# Patient Record
Sex: Female | Born: 1960 | Race: Black or African American | Hispanic: No | Marital: Married | Smoking: Current every day smoker
Health system: Southern US, Community
[De-identification: ages and names within clinical notes are randomized; demographics above are authoritative.]

## PROBLEM LIST (undated history)

## (undated) DIAGNOSIS — I10 Essential (primary) hypertension: Secondary | ICD-10-CM

## (undated) DIAGNOSIS — K5780 Diverticulitis of intestine, part unspecified, with perforation and abscess without bleeding: Secondary | ICD-10-CM

## (undated) HISTORY — PX: HERNIA REPAIR: SHX51

---

## 2016-03-28 DIAGNOSIS — F17200 Nicotine dependence, unspecified, uncomplicated: Secondary | ICD-10-CM | POA: Insufficient documentation

## 2016-03-28 DIAGNOSIS — L309 Dermatitis, unspecified: Secondary | ICD-10-CM | POA: Insufficient documentation

## 2016-04-04 DIAGNOSIS — R079 Chest pain, unspecified: Secondary | ICD-10-CM | POA: Insufficient documentation

## 2016-04-04 DIAGNOSIS — I10 Essential (primary) hypertension: Secondary | ICD-10-CM | POA: Insufficient documentation

## 2016-12-06 DIAGNOSIS — K5792 Diverticulitis of intestine, part unspecified, without perforation or abscess without bleeding: Secondary | ICD-10-CM | POA: Insufficient documentation

## 2016-12-06 DIAGNOSIS — Z720 Tobacco use: Secondary | ICD-10-CM | POA: Insufficient documentation

## 2019-01-07 ENCOUNTER — Emergency Department (HOSPITAL_COMMUNITY)
Admission: EM | Admit: 2019-01-07 | Discharge: 2019-01-07 | Disposition: A | Discharge status: Home / Self Care | Payer: BLUE CROSS/BLUE SHIELD | Attending: Emergency Medicine | Admitting: Emergency Medicine

## 2019-01-07 ENCOUNTER — Encounter (HOSPITAL_COMMUNITY): Payer: Self-pay | Admitting: *Deleted

## 2019-01-07 DIAGNOSIS — M545 Low back pain: Secondary | ICD-10-CM | POA: Diagnosis present

## 2019-01-07 DIAGNOSIS — M5432 Sciatica, left side: Secondary | ICD-10-CM | POA: Diagnosis not present

## 2019-01-07 MED ORDER — PREDNISONE 20 MG PO TABS: ORAL_TABLET | ORAL | 0 refills | Status: DC

## 2019-01-07 MED ORDER — IBUPROFEN 600 MG PO TABS: 600.0000 mg | ORAL_TABLET | Freq: Four times a day (QID) | ORAL | 0 refills | Status: DC | PRN

## 2019-01-07 NOTE — ED Notes (Signed)
Pt discharged from ED; instructions provided and scripts given; Pt encouraged to return to ED if symptoms worsen and to f/u with PCP; Pt verbalized understanding of all instructions 

## 2019-01-07 NOTE — ED Provider Notes (Signed)
[facility] Provider Note   CSN: [id number] Arrival date & time: 01/07/19  1808     History   Chief Complaint Chief Complaint  Patient presents with  . Back Pain    HPI Elaine Martinez is a 58 y.o. female.  The history is provided by the patient. No language interpreter was used.  Back Pain  Associated symptoms: no fever and no numbness      58 year old female presenting for evaluation of back pain.  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  She does a lot of heavy lifting and 3 months ago she injured her lower back.  Since then she has had recurrent pain to her lower back but for the past 5 days pain now radiates down to her left leg extending towards her knee.  She described pain as a sharp shooting pain, moderate in severity worsening with ambulation.  She tries taking Flexeril that was previously prescribed with some improvement.  She does not complain of any fever or chills no lightheadedness or dizziness no abdominal pain dysuria hematuria, bowel bladder incontinence or saddle anesthesia.  No prior history of PE or DVT, no recent surgery, prolonged bedrest, active cancer, hemoptysis, leg swelling or pain in her calf.  History reviewed. No pertinent past medical history.  There are no active problems to display for this patient.    The histories are not reviewed yet. Please review them in the "History" navigator section and refresh this SmartLink.   OB History   No obstetric history on file.      Home Medications    Prior to Admission medications   Not on File    Family History History reviewed. No pertinent family history.  Social History Social History   Tobacco Use  . Smoking status: Not on file  Substance Use Topics  . Alcohol use: Not on file  . Drug use: Not on file     Allergies   Patient has no known allergies.   Review of Systems Review of Systems    Constitutional: Negative for fever.  Musculoskeletal: Positive for back pain.  Neurological: Negative for numbness.  All other systems reviewed and are negative.    Physical Exam Updated Vital Signs BP (!) 135/115 (BP Location: Right Arm)   Pulse (!) 105   Temp 98.3 F (36.8 C) (Oral)   Resp 18   SpO2 98%   Physical Exam Vitals signs and nursing note reviewed.  Constitutional:      General: She is not in acute distress.    Appearance: She is well-developed.  HENT:     Head: Atraumatic.  Eyes:     Conjunctiva/sclera: Conjunctivae normal.  Neck:     Musculoskeletal: Neck supple.  Musculoskeletal:        General: Tenderness (Mild tenderness to lumbar and paralumbar spinal muscle.  Positive straight leg raise left leg.  Patellar deep tendon reflex intact bilaterally with intact distal pedal pulse.  Able to ambulate.) present.  Skin:    Findings: No rash.  Neurological:     Mental Status: She is alert.      ED Treatments / Results  Labs (all labs ordered are listed, but only abnormal results are displayed) Labs Reviewed - No data to display  EKG None  Radiology No results found.  Procedures Procedures (including critical care time)  Medications Ordered in ED Medications - No data to display   Initial Impression /  Assessment and Plan / ED Course  I have reviewed the triage vital signs and the nursing notes.  Pertinent labs & imaging results that were available during my care of the patient were reviewed by me and considered in my medical decision making (see chart for details).     BP (!) 135/115 (BP Location: Right Arm)   Pulse (!) 105   Temp 98.3 F (36.8 C) (Oral)   Resp 18   SpO2 98%    Final Clinical Impressions(s) / ED Diagnoses   Final diagnoses:  Sciatica, left side    ED Discharge Orders    None     7:18 PM Patient here with radicular left leg pain suggestive of sciatica.  Low suspicion for DVT.  No red flags.  She is able to  ambulate.  She is neurovascular intact.  Patient discharged home with steroids, and anti-inflammatory medication.  Return precaution discussed.   Fayrene Helper, PA-C 01/07/19 2028    Eber Hong, MD 01/09/19 [phone]

## 2019-01-07 NOTE — ED Triage Notes (Signed)
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

## 2020-02-27 ENCOUNTER — Ambulatory Visit: Payer: BLUE CROSS/BLUE SHIELD | Attending: Internal Medicine

## 2020-02-27 DIAGNOSIS — Z23 Encounter for immunization: Secondary | ICD-10-CM

## 2020-02-27 NOTE — Progress Notes (Signed)
   Covid-19 Vaccination Clinic  Name:  Elaine Martinez    MRN: [id number] DOB: September 22, 1961  02/27/2020  Elaine Martinez was observed post Covid-19 immunization for 15 minutes without incident. She was provided with Vaccine Information Sheet and instruction to access the V-Safe system.   Elaine Martinez was instructed to call 911 with any severe reactions post vaccine: Marland Kitchen Difficulty breathing  . Swelling of face and throat  . A fast heartbeat  . A bad rash all over body  . Dizziness and weakness   Immunizations Administered    Name Date Dose VIS Date Route   Pfizer COVID-19 Vaccine 02/27/2020  4:15 PM 0.3 mL 11/29/2019 Intramuscular   Manufacturer: [employer], [employer]   Lot: [id number]   NDC: [address]-5

## 2020-03-23 ENCOUNTER — Ambulatory Visit: Payer: BLUE CROSS/BLUE SHIELD | Attending: Internal Medicine

## 2020-03-23 ENCOUNTER — Ambulatory Visit: Payer: BLUE CROSS/BLUE SHIELD

## 2020-03-23 DIAGNOSIS — Z23 Encounter for immunization: Secondary | ICD-10-CM

## 2020-03-23 NOTE — Progress Notes (Signed)
   Covid-19 Vaccination Clinic  Name:  Elaine Martinez    MRN: [id number] DOB: Sep 29, 1961  03/23/2020  [name] was observed post Covid-19 immunization for 15 minutes without incident. She was provided with Vaccine Information Sheet and instruction to access the V-Safe system.   [name] was instructed to call 911 with any severe reactions post vaccine: Marland Kitchen Difficulty breathing  . Swelling of face and throat  . A fast heartbeat  . A bad rash all over body  . Dizziness and weakness   Immunizations Administered    Name Date Dose VIS Date Route   Pfizer COVID-19 Vaccine 03/23/2020  2:27 PM 0.3 mL 11/29/2019 Intramuscular   Manufacturer: [employer], [employer]   Lot: [id number]   NDC: [address]-8

## 2020-10-20 ENCOUNTER — Other Ambulatory Visit: Payer: Self-pay

## 2020-10-20 ENCOUNTER — Inpatient Hospital Stay (HOSPITAL_COMMUNITY)
Admission: EM | Admit: 2020-10-20 | Discharge: 2020-10-22 | DRG: 392 | Disposition: A | Discharge status: Home / Self Care | Payer: BLUE CROSS/BLUE SHIELD | Attending: Internal Medicine | Admitting: Internal Medicine

## 2020-10-20 DIAGNOSIS — K529 Noninfective gastroenteritis and colitis, unspecified: Principal | ICD-10-CM | POA: Diagnosis present

## 2020-10-20 DIAGNOSIS — E860 Dehydration: Secondary | ICD-10-CM | POA: Diagnosis present

## 2020-10-20 DIAGNOSIS — I10 Essential (primary) hypertension: Secondary | ICD-10-CM | POA: Diagnosis present

## 2020-10-20 DIAGNOSIS — R188 Other ascites: Secondary | ICD-10-CM

## 2020-10-20 DIAGNOSIS — E872 Acidosis, unspecified: Secondary | ICD-10-CM

## 2020-10-20 DIAGNOSIS — F17200 Nicotine dependence, unspecified, uncomplicated: Secondary | ICD-10-CM | POA: Diagnosis present

## 2020-10-20 DIAGNOSIS — R000 Tachycardia, unspecified: Secondary | ICD-10-CM | POA: Diagnosis present

## 2020-10-20 DIAGNOSIS — E669 Obesity, unspecified: Secondary | ICD-10-CM | POA: Diagnosis present

## 2020-10-20 DIAGNOSIS — Z20822 Contact with and (suspected) exposure to covid-19: Secondary | ICD-10-CM | POA: Diagnosis present

## 2020-10-20 DIAGNOSIS — E876 Hypokalemia: Secondary | ICD-10-CM | POA: Diagnosis present

## 2020-10-20 DIAGNOSIS — K5730 Diverticulosis of large intestine without perforation or abscess without bleeding: Secondary | ICD-10-CM | POA: Diagnosis present

## 2020-10-20 DIAGNOSIS — N179 Acute kidney failure, unspecified: Secondary | ICD-10-CM | POA: Diagnosis present

## 2020-10-20 DIAGNOSIS — D649 Anemia, unspecified: Secondary | ICD-10-CM | POA: Diagnosis present

## 2020-10-20 DIAGNOSIS — R197 Diarrhea, unspecified: Secondary | ICD-10-CM

## 2020-10-20 DIAGNOSIS — R1030 Lower abdominal pain, unspecified: Secondary | ICD-10-CM | POA: Diagnosis not present

## 2020-10-20 DIAGNOSIS — R109 Unspecified abdominal pain: Secondary | ICD-10-CM

## 2020-10-20 DIAGNOSIS — R6510 Systemic inflammatory response syndrome (SIRS) of non-infectious origin without acute organ dysfunction: Secondary | ICD-10-CM | POA: Diagnosis present

## 2020-10-20 DIAGNOSIS — Z9851 Tubal ligation status: Secondary | ICD-10-CM

## 2020-10-20 DIAGNOSIS — Z6837 Body mass index (BMI) 37.0-37.9, adult: Secondary | ICD-10-CM

## 2020-10-20 HISTORY — DX: Diverticulitis of intestine, part unspecified, with perforation and abscess without bleeding: K57.80

## 2020-10-20 LAB — URINALYSIS, ROUTINE W REFLEX MICROSCOPIC
Glucose, UA: NEGATIVE mg/dL
Ketones, ur: 5 mg/dL — AB
Leukocytes,Ua: NEGATIVE
Nitrite: NEGATIVE
Protein, ur: 30 mg/dL — AB
Specific Gravity, Urine: 1.021 (ref 1.005–1.030)
pH: 5 (ref 5.0–8.0)

## 2020-10-20 LAB — COMPREHENSIVE METABOLIC PANEL
ALT: 24 U/L (ref 0–44)
AST: 32 U/L (ref 15–41)
Albumin: 4 g/dL (ref 3.5–5.0)
Alkaline Phosphatase: 77 U/L (ref 38–126)
Anion gap: 13 (ref 5–15)
BUN: 23 mg/dL — ABNORMAL HIGH (ref 6–20)
CO2: 20 mmol/L — ABNORMAL LOW (ref 22–32)
Calcium: 9.4 mg/dL (ref 8.9–10.3)
Chloride: 104 mmol/L (ref 98–111)
Creatinine, Ser: 2.4 mg/dL — ABNORMAL HIGH (ref 0.44–1.00)
GFR, Estimated: 23 mL/min — ABNORMAL LOW (ref >60)
Glucose, Bld: 123 mg/dL — ABNORMAL HIGH (ref 70–99)
Potassium: 3.5 mmol/L (ref 3.5–5.1)
Sodium: 137 mmol/L (ref 135–145)
Total Bilirubin: 0.8 mg/dL (ref 0.3–1.2)
Total Protein: 7.6 g/dL (ref 6.5–8.1)

## 2020-10-20 LAB — CBC
HCT: 48.5 % — ABNORMAL HIGH (ref 36.0–46.0)
Hemoglobin: 16.5 g/dL — ABNORMAL HIGH (ref 12.0–15.0)
MCH: 33.7 pg (ref 26.0–34.0)
MCHC: 34 g/dL (ref 30.0–36.0)
MCV: 99.2 fL (ref 80.0–100.0)
Platelets: 253 10*3/uL (ref 150–400)
RBC: 4.89 MIL/uL (ref 3.87–5.11)
RDW: 12.4 % (ref 11.5–15.5)
WBC: 17.5 10*3/uL — ABNORMAL HIGH (ref 4.0–10.5)
nRBC: 0 % (ref 0.0–0.2)

## 2020-10-20 LAB — LIPASE, BLOOD: Lipase: 31 U/L (ref 11–51)

## 2020-10-20 NOTE — ED Notes (Signed)
Pt stated she was stepping outside

## 2020-10-20 NOTE — ED Triage Notes (Signed)
C/o upper abdominal pain started yesterday along w/ vomiting.

## 2020-10-21 ENCOUNTER — Observation Stay (HOSPITAL_COMMUNITY): Payer: BLUE CROSS/BLUE SHIELD

## 2020-10-21 ENCOUNTER — Emergency Department (HOSPITAL_COMMUNITY): Payer: BLUE CROSS/BLUE SHIELD

## 2020-10-21 ENCOUNTER — Encounter (HOSPITAL_COMMUNITY): Payer: Self-pay | Admitting: Internal Medicine

## 2020-10-21 DIAGNOSIS — Z20822 Contact with and (suspected) exposure to covid-19: Secondary | ICD-10-CM | POA: Diagnosis present

## 2020-10-21 DIAGNOSIS — E876 Hypokalemia: Secondary | ICD-10-CM | POA: Diagnosis present

## 2020-10-21 DIAGNOSIS — E872 Acidosis, unspecified: Secondary | ICD-10-CM

## 2020-10-21 DIAGNOSIS — R197 Diarrhea, unspecified: Secondary | ICD-10-CM

## 2020-10-21 DIAGNOSIS — E669 Obesity, unspecified: Secondary | ICD-10-CM | POA: Diagnosis present

## 2020-10-21 DIAGNOSIS — N179 Acute kidney failure, unspecified: Secondary | ICD-10-CM | POA: Diagnosis present

## 2020-10-21 DIAGNOSIS — R109 Unspecified abdominal pain: Secondary | ICD-10-CM | POA: Diagnosis present

## 2020-10-21 DIAGNOSIS — I10 Essential (primary) hypertension: Secondary | ICD-10-CM | POA: Diagnosis present

## 2020-10-21 DIAGNOSIS — E860 Dehydration: Secondary | ICD-10-CM | POA: Diagnosis present

## 2020-10-21 DIAGNOSIS — D649 Anemia, unspecified: Secondary | ICD-10-CM | POA: Diagnosis present

## 2020-10-21 DIAGNOSIS — R6510 Systemic inflammatory response syndrome (SIRS) of non-infectious origin without acute organ dysfunction: Secondary | ICD-10-CM

## 2020-10-21 DIAGNOSIS — R1030 Lower abdominal pain, unspecified: Secondary | ICD-10-CM | POA: Diagnosis present

## 2020-10-21 DIAGNOSIS — K5730 Diverticulosis of large intestine without perforation or abscess without bleeding: Secondary | ICD-10-CM | POA: Diagnosis present

## 2020-10-21 DIAGNOSIS — Z9851 Tubal ligation status: Secondary | ICD-10-CM | POA: Diagnosis not present

## 2020-10-21 DIAGNOSIS — R000 Tachycardia, unspecified: Secondary | ICD-10-CM | POA: Diagnosis present

## 2020-10-21 DIAGNOSIS — Z6837 Body mass index (BMI) 37.0-37.9, adult: Secondary | ICD-10-CM | POA: Diagnosis not present

## 2020-10-21 DIAGNOSIS — K529 Noninfective gastroenteritis and colitis, unspecified: Secondary | ICD-10-CM | POA: Diagnosis present

## 2020-10-21 DIAGNOSIS — F17200 Nicotine dependence, unspecified, uncomplicated: Secondary | ICD-10-CM | POA: Diagnosis present

## 2020-10-21 LAB — CBC
HCT: 43 % (ref 36.0–46.0)
Hemoglobin: 14.4 g/dL (ref 12.0–15.0)
MCH: 34 pg (ref 26.0–34.0)
MCHC: 33.5 g/dL (ref 30.0–36.0)
MCV: 101.4 fL — ABNORMAL HIGH (ref 80.0–100.0)
Platelets: 203 10*3/uL (ref 150–400)
RBC: 4.24 MIL/uL (ref 3.87–5.11)
RDW: 12.5 % (ref 11.5–15.5)
WBC: 14 10*3/uL — ABNORMAL HIGH (ref 4.0–10.5)
nRBC: 0 % (ref 0.0–0.2)

## 2020-10-21 LAB — SODIUM, URINE, RANDOM: Sodium, Ur: 62 mmol/L

## 2020-10-21 LAB — RESPIRATORY PANEL BY RT PCR (FLU A&B, COVID)
Influenza A by PCR: NEGATIVE
Influenza B by PCR: NEGATIVE
SARS Coronavirus 2 by RT PCR: NEGATIVE

## 2020-10-21 LAB — BASIC METABOLIC PANEL
Anion gap: 13 (ref 5–15)
BUN: 26 mg/dL — ABNORMAL HIGH (ref 6–20)
CO2: 20 mmol/L — ABNORMAL LOW (ref 22–32)
Calcium: 8.5 mg/dL — ABNORMAL LOW (ref 8.9–10.3)
Chloride: 105 mmol/L (ref 98–111)
Creatinine, Ser: 2.76 mg/dL — ABNORMAL HIGH (ref 0.44–1.00)
GFR, Estimated: 19 mL/min — ABNORMAL LOW (ref >60)
Glucose, Bld: 100 mg/dL — ABNORMAL HIGH (ref 70–99)
Potassium: 3.3 mmol/L — ABNORMAL LOW (ref 3.5–5.1)
Sodium: 138 mmol/L (ref 135–145)

## 2020-10-21 LAB — HIV ANTIBODY (ROUTINE TESTING W REFLEX): HIV Screen 4th Generation wRfx: NONREACTIVE

## 2020-10-21 LAB — CREATININE, URINE, RANDOM: Creatinine, Urine: 122.01 mg/dL

## 2020-10-21 LAB — LACTIC ACID, PLASMA
Lactic Acid, Venous: 1 mmol/L (ref 0.5–1.9)
Lactic Acid, Venous: 0.9 mmol/L (ref 0.5–1.9)

## 2020-10-21 MED ORDER — PIPERACILLIN-TAZOBACTAM 3.375 G IVPB 30 MIN
3.3750 g | Freq: Once | INTRAVENOUS | Status: AC
  Administered 2020-10-21: 3.375 g via INTRAVENOUS
  Filled 2020-10-21: qty 50

## 2020-10-21 MED ORDER — SODIUM CHLORIDE 0.9 % IV SOLN
2.0000 g | INTRAVENOUS | Status: DC
  Administered 2020-10-21 – 2020-10-22 (×2): 2 g via INTRAVENOUS
  Filled 2020-10-21: qty 20
  Filled 2020-10-21: qty 2
  Filled 2020-10-21: qty 20

## 2020-10-21 MED ORDER — ACETAMINOPHEN 650 MG RE SUPP: 650.0000 mg | Freq: Four times a day (QID) | RECTAL | Status: DC | PRN

## 2020-10-21 MED ORDER — SODIUM CHLORIDE 0.9 % IV BOLUS
1000.0000 mL | Freq: Once | INTRAVENOUS | Status: AC
  Administered 2020-10-21: 1000 mL via INTRAVENOUS

## 2020-10-21 MED ORDER — HEPARIN SODIUM (PORCINE) 5000 UNIT/ML IJ SOLN
5000.0000 [IU] | Freq: Three times a day (TID) | INTRAMUSCULAR | Status: DC
  Administered 2020-10-21 – 2020-10-22 (×5): 5000 [IU] via SUBCUTANEOUS
  Filled 2020-10-21 (×5): qty 1

## 2020-10-21 MED ORDER — ONDANSETRON HCL 4 MG/2ML IJ SOLN: 4.0000 mg | Freq: Four times a day (QID) | INTRAMUSCULAR | Status: DC | PRN

## 2020-10-21 MED ORDER — SODIUM CHLORIDE 0.9 % IV SOLN: INTRAVENOUS | Status: DC

## 2020-10-21 MED ORDER — ACETAMINOPHEN 325 MG PO TABS
650.0000 mg | ORAL_TABLET | Freq: Four times a day (QID) | ORAL | Status: DC | PRN
  Administered 2020-10-21 – 2020-10-22 (×3): 650 mg via ORAL
  Filled 2020-10-21 (×3): qty 2

## 2020-10-21 MED ORDER — SIMETHICONE 80 MG PO CHEW: 80.0000 mg | CHEWABLE_TABLET | Freq: Four times a day (QID) | ORAL | Status: DC | PRN

## 2020-10-21 MED ORDER — MORPHINE SULFATE (PF) 4 MG/ML IV SOLN
4.0000 mg | Freq: Once | INTRAVENOUS | Status: AC
  Administered 2020-10-21: 4 mg via INTRAVENOUS
  Filled 2020-10-21: qty 1

## 2020-10-21 MED ORDER — LACTATED RINGERS IV BOLUS
1000.0000 mL | Freq: Once | INTRAVENOUS | Status: AC
  Administered 2020-10-21: 1000 mL via INTRAVENOUS

## 2020-10-21 MED ORDER — LACTATED RINGERS IV SOLN: INTRAVENOUS | Status: DC

## 2020-10-21 MED ORDER — POTASSIUM CHLORIDE CRYS ER 20 MEQ PO TBCR
40.0000 meq | EXTENDED_RELEASE_TABLET | Freq: Once | ORAL | Status: AC
  Administered 2020-10-21: 40 meq via ORAL
  Filled 2020-10-21: qty 2

## 2020-10-21 MED ORDER — PIPERACILLIN-TAZOBACTAM 3.375 G IVPB: 3.3750 g | Freq: Three times a day (TID) | INTRAVENOUS | Status: DC

## 2020-10-21 MED ORDER — SODIUM CHLORIDE 0.9 % IV BOLUS
500.0000 mL | Freq: Once | INTRAVENOUS | Status: AC
  Administered 2020-10-21: 500 mL via INTRAVENOUS

## 2020-10-21 MED ORDER — METRONIDAZOLE IN NACL 5-0.79 MG/ML-% IV SOLN
500.0000 mg | Freq: Three times a day (TID) | INTRAVENOUS | Status: DC
  Administered 2020-10-21 – 2020-10-22 (×4): 500 mg via INTRAVENOUS
  Filled 2020-10-21 (×4): qty 100

## 2020-10-21 MED ORDER — ONDANSETRON HCL 4 MG/2ML IJ SOLN
4.0000 mg | Freq: Once | INTRAMUSCULAR | Status: AC
  Administered 2020-10-21: 4 mg via INTRAVENOUS
  Filled 2020-10-21: qty 2

## 2020-10-21 NOTE — ED Provider Notes (Signed)
[facility] EMERGENCY DEPARTMENT Provider Note   CSN: [id number] Arrival date & time: 10/20/20  1730     History Chief Complaint  Patient presents with  . Abdominal Pain    Elaine Martinez is a 59 y.o. female.  59 yo F with a chief complaints of lower abdominal pain.  This been going on for the past few days.  She has been having diarrhea.  She also has been coughing quite a bit.  No fevers a couple episodes of emesis.  She has a history of diverticulitis that she thinks feels somewhat similar.  Has a history of hernia repair with mesh placement that had subsequent mesh removal.  Describes the pain is sharp and shooting.  Worse with coughing.  Has not felt like eating or drinking.  Has forced her self to drink a little bit.  The history is provided by the patient.  Abdominal Pain Pain location:  LLQ and RLQ Pain quality: aching and sharp   Pain radiates to:  Does not radiate Pain severity:  Moderate Onset quality:  Gradual Duration:  2 days Timing:  Constant Progression:  Worsening Chronicity:  New Relieved by:  Nothing Worsened by:  Nothing Ineffective treatments:  None tried Associated symptoms: diarrhea   Associated symptoms: no chest pain, no chills, no dysuria, no fever, no nausea, no shortness of breath and no vomiting        History reviewed. No pertinent past medical history.  Patient Active Problem List   Diagnosis Date Noted  . Abdominal pain 10/21/2020    History reviewed. No pertinent surgical history.   OB History   No obstetric history on file.     No family history on file.  Social History   Tobacco Use  . Smoking status: Not on file  Substance Use Topics  . Alcohol use: Not on file  . Drug use: Not on file    Home Medications Prior to Admission medications   Medication Sig Start Date End Date Taking? Authorizing Provider  ibuprofen (ADVIL,MOTRIN) 600 MG tablet Take 1 tablet (600 mg total) by mouth every 6 (six) hours as  needed. 01/07/19   Fayrene Helper, PA-C  predniSONE (DELTASONE) 20 MG tablet 3 tabs po day one, then 2 tabs daily x 4 days 01/07/19   Fayrene Helper, PA-C    Allergies    Patient has no known allergies.  Review of Systems   Review of Systems  Constitutional: Negative for chills and fever.  HENT: Negative for congestion and rhinorrhea.   Eyes: Negative for redness and visual disturbance.  Respiratory: Negative for shortness of breath and wheezing.   Cardiovascular: Negative for chest pain and palpitations.  Gastrointestinal: Positive for abdominal pain and diarrhea. Negative for nausea and vomiting.  Genitourinary: Negative for dysuria and urgency.  Musculoskeletal: Negative for arthralgias and myalgias.  Skin: Negative for pallor and wound.  Neurological: Negative for dizziness and headaches.    Physical Exam Updated Vital Signs BP (!) 92/58   Pulse (!) 111   Temp 97.9 F (36.6 C) (Oral)   Resp (!) 28   SpO2 100%   Physical Exam Vitals and nursing note reviewed.  Constitutional:      General: She is not in acute distress.    Appearance: She is well-developed. She is obese. She is not diaphoretic.  HENT:     Head: Normocephalic and atraumatic.  Eyes:     Pupils: Pupils are equal, round, and reactive to light.  Cardiovascular:  Rate and Rhythm: Regular rhythm. Tachycardia present.     Heart sounds: No murmur heard.  No friction rub. No gallop.   Pulmonary:     Effort: Pulmonary effort is normal.     Breath sounds: No wheezing or rales.  Abdominal:     General: There is no distension.     Palpations: Abdomen is soft.     Tenderness: There is abdominal tenderness (worst suprapubic).  Musculoskeletal:        General: No tenderness.     Cervical back: Normal range of motion and neck supple.  Skin:    General: Skin is warm and dry.  Neurological:     Mental Status: She is alert and oriented to person, place, and time.  Psychiatric:        Behavior: Behavior normal.      ED Results / Procedures / Treatments   Labs (all labs ordered are listed, but only abnormal results are displayed) Labs Reviewed  COMPREHENSIVE METABOLIC PANEL - Abnormal; Notable for the following components:      Result Value   CO2 20 (*)    Glucose, Bld 123 (*)    BUN 23 (*)    Creatinine, Ser 2.40 (*)    GFR, Estimated 23 (*)    All other components within normal limits  CBC - Abnormal; Notable for the following components:   WBC 17.5 (*)    Hemoglobin 16.5 (*)    HCT 48.5 (*)    All other components within normal limits  URINALYSIS, ROUTINE W REFLEX MICROSCOPIC - Abnormal; Notable for the following components:   Color, Urine AMBER (*)    APPearance CLOUDY (*)    Hgb urine dipstick SMALL (*)    Bilirubin Urine SMALL (*)    Ketones, ur 5 (*)    Protein, ur 30 (*)    Bacteria, UA RARE (*)    All other components within normal limits  RESPIRATORY PANEL BY RT PCR (FLU A&B, COVID)  LIPASE, BLOOD  LACTIC ACID, PLASMA  LACTIC ACID, PLASMA  I-STAT BETA HCG BLOOD, ED ([facility], [facility], [facility] ONLY)    EKG EKG Interpretation  Date/Time:  Tuesday October 20 2020 18:03:24 EDT Ventricular Rate:  136 PR Interval:  120 QRS Duration: 64 QT Interval:  284 QTC Calculation: 427 R Axis:   23 Text Interpretation: Sinus tachycardia Cannot rule out Anterior infarct , age undetermined Abnormal ECG No old tracing to compare Confirmed by Melene Plan [phone]) on 10/21/2020 12:16:50 AM   Radiology CT ABDOMEN PELVIS WO CONTRAST  Result Date: 10/21/2020 CLINICAL DATA:  Upper abdominal pain, vomiting EXAM: CT ABDOMEN AND PELVIS WITHOUT CONTRAST TECHNIQUE: Multidetector CT imaging of the abdomen and pelvis was performed following the standard protocol without IV contrast. COMPARISON:  12/06/2016 FINDINGS: Lower chest: Lung bases are clear. No effusions. Heart is normal size. Hepatobiliary: No focal hepatic abnormality. Gallbladder unremarkable. Pancreas: No focal abnormality or ductal dilatation.  Spleen: No focal abnormality.  Normal size. Adrenals/Urinary Tract: No adrenal abnormality. No focal renal abnormality. No stones or hydronephrosis. Urinary bladder is unremarkable. Stomach/Bowel: Diffuse colonic diverticulosis. No active diverticulitis. Normal appendix. Stomach and small bowel decompressed, unremarkable. Vascular/Lymphatic: Aortic atherosclerosis. No evidence of aneurysm or adenopathy. Reproductive: Uterus and adnexa unremarkable.  No mass. Other: Moderate free fluid in the pelvis of unknown etiology. No free air. Musculoskeletal: No acute bony abnormality. IMPRESSION: Diffuse colonic diverticulosis.  No active diverticulitis. Moderate free fluid in the pelvis of unknown etiology. Aortic atherosclerosis. Electronically Signed   By:  Charlett Nose M.D.   On: 10/21/2020 01:09   DG Chest Port 1 View  Result Date: 10/21/2020 CLINICAL DATA:  Cough EXAM: PORTABLE CHEST 1 VIEW COMPARISON:  None. FINDINGS: The heart size and mediastinal contours are within normal limits. Both lungs are clear. The visualized skeletal structures are unremarkable. IMPRESSION: No active disease. Electronically Signed   By: Helyn Numbers MD   On: 10/21/2020 00:47    Procedures Procedures (including critical care time)  Medications Ordered in ED Medications  piperacillin-tazobactam (ZOSYN) IVPB 3.375 g (3.375 g Intravenous New Bag/Given 10/21/20 0218)  morphine 4 MG/ML injection 4 mg (has no administration in time range)  ondansetron (ZOFRAN) injection 4 mg (has no administration in time range)  sodium chloride 0.9 % bolus 1,000 mL (1,000 mLs Intravenous New Bag/Given 10/21/20 0048)  morphine 4 MG/ML injection 4 mg (4 mg Intravenous Given 10/21/20 0048)  ondansetron (ZOFRAN) injection 4 mg (4 mg Intravenous Given 10/21/20 0048)    ED Course  I have reviewed the triage vital signs and the nursing notes.  Pertinent labs & imaging results that were available during my care of the patient were reviewed by me and  considered in my medical decision making (see chart for details).    MDM Rules/Calculators/A&P                          59 yo F with a chief complaints of lower abdominal pain.  This been going on for the past few days.  She has been having diarrhea.  She also has been coughing quite a bit.  No fevers a couple episodes of emesis.  She has a history of diverticulitis that she thinks feels somewhat similar.  Has a history of hernia repair with mesh placement that had subsequent mesh removal.  Tachycardic into the 130s.  Patient has a white blood cell counts of over 17,000.  Will obtain a CT scan.  UA without obvious infection.  No significant LFT elevation.  Lipase is negative.  Patient with an AKI, looks like her baseline from care everywhere is likely 0.8 today's 2.4.  CT scan without obvious pathology.  Diverticulosis without diverticulitis.  There is free fluid in the pelvis of unknown etiology.  She is reassessed with some improvement of her symptoms.  She still tachycardic with a heart rate in the 100s.  We will start on Zosyn to cover for intra-abdominal infection.  Discussed with medicine for admission.   CRITICAL CARE Performed by: Rae Roam   Total critical care time: 35 minutes  Critical care time was exclusive of separately billable procedures and treating other patients.  Critical care was necessary to treat or prevent imminent or life-threatening deterioration.  Critical care was time spent personally by me on the following activities: development of treatment plan with patient and/or surrogate as well as nursing, discussions with consultants, evaluation of patient's response to treatment, examination of patient, obtaining history from patient or surrogate, ordering and performing treatments and interventions, ordering and review of laboratory studies, ordering and review of radiographic studies, pulse oximetry and re-evaluation of patient's condition.  The patients  results and plan were reviewed and discussed.   Any x-rays performed were independently reviewed by myself.   Differential diagnosis were considered with the presenting HPI.  Medications  piperacillin-tazobactam (ZOSYN) IVPB 3.375 g (3.375 g Intravenous New Bag/Given 10/21/20 0218)  morphine 4 MG/ML injection 4 mg (has no administration in time range)  ondansetron (ZOFRAN)  injection 4 mg (has no administration in time range)  sodium chloride 0.9 % bolus 1,000 mL (1,000 mLs Intravenous New Bag/Given 10/21/20 0048)  morphine 4 MG/ML injection 4 mg (4 mg Intravenous Given 10/21/20 0048)  ondansetron (ZOFRAN) injection 4 mg (4 mg Intravenous Given 10/21/20 0048)    Vitals:   10/20/20 2223 10/21/20 0145 10/21/20 0200 10/21/20 0221  BP: 101/67 100/68 105/61 (!) 92/58  Pulse: (!) 128 (!) 111 (!) 109 (!) 111  Resp: 20   (!) 28  Temp:      TempSrc:      SpO2: 100% 98% 98% 100%    Final diagnoses:  Lower abdominal pain    Admission/ observation were discussed with the admitting physician, patient and/or family and they are comfortable with the plan.    Final Clinical Impression(s) / ED Diagnoses Final diagnoses:  Lower abdominal pain    Rx / DC Orders ED Discharge Orders    None       Melene PlanFloyd, Bailley [place], DO 10/21/20 0231

## 2020-10-21 NOTE — H&P (Signed)
History and Physical    Elaine Martinez HRC:[id number] DOB: [birth year] DOA: 10/20/2020  PCP: Patient, No Pcp Per Patient coming from: Home  Chief Complaint: Abdominal pain, diarrhea  HPI: Elaine Martinez is a 59 y.o. female with medical history significant of hypertension, depression, obesity presenting to the ED with complaints of lower abdominal pain and diarrhea.  Patient reports 2-day history of bilateral lower quadrant abdominal pain which is sharp, constant, and radiates to her left upper quadrant.  States it feels like a "ball" in her abdomen.  She had vomiting 2 days ago which has now resolved.  She is no longer nauseous.  However, she continues to have nonbloody watery diarrhea which was initially brown but now appears green.  Denies fevers.  Denies recent antibiotic use.  She is currently sexually active with her husband.  Denies dyspareunia or noticing any abnormal vaginal discharge.  Denies dysuria.  States she has been urinating more frequently for the past 1 month but has also been drinking more water.  States she had abdominal hernia mesh repair surgery done twice in the past over a decade ago.  Patient has no other complaints.  States she has been vaccinated against Covid.  Denies cough, shortness of breath, or chest pain.  ED Course: Afebrile.  Tachycardic with heart rate up to 120s.  Not hypotensive.  WBC 17.5, hemoglobin 16.5, hematocrit 48.5, platelet 253k.  Sodium 137, potassium 3.5, chloride 104, bicarb 20, BUN 23, creatinine 2.4, glucose 123.  Lipase and LFTs normal.  UA (not clean-catch) with negative nitrite, negative leukocytes, 6-10 WBCs, and rare bacteria.  SARS-CoV-2 PCR test pending.  Lactic acid level pending.  Chest x-ray showing no active disease.  CT abdomen pelvis showing diffuse colonic diverticulosis without active diverticulitis.  Moderate free fluid in the pelvis of unknown etiology.  Uterus and adnexa unremarkable; no mass.  Patient received Zosyn, morphine, Zofran, and 1  L normal saline bolus.  Review of Systems:  All systems reviewed and apart from history of presenting illness, are negative.  Past medical history: See HPI  Past surgical history: Cesarean section, hernia repair, oophorectomy, polypectomy, tubal ligation  Family history: Mother-diabetes.  Father-diabetes.  Social history: Current everyday smoker.  No alcohol or drug use.  No Known Allergies  Prior to Admission medications   Medication Sig Start Date End Date Taking? Authorizing Provider  ibuprofen (ADVIL,MOTRIN) 600 MG tablet Take 1 tablet (600 mg total) by mouth every 6 (six) hours as needed. 01/07/19   Fayrene Helper, PA-C  predniSONE (DELTASONE) 20 MG tablet 3 tabs po day one, then 2 tabs daily x 4 days 01/07/19   Fayrene Helper, PA-C    Physical Exam: Vitals:   10/20/20 2223 10/21/20 0145 10/21/20 0200 10/21/20 0221  BP: 101/67 100/68 105/61 (!) 92/58  Pulse: (!) 128 (!) 111 (!) 109 (!) 111  Resp: 20   (!) 28  Temp:      TempSrc:      SpO2: 100% 98% 98% 100%  Weight:    97.2 kg  Height:    5\' 3"  (1.6 m)    Physical Exam Constitutional:      General: She is not in acute distress. HENT:     Head: Normocephalic and atraumatic.     Mouth/Throat:     Mouth: Mucous membranes are dry.     Pharynx: Oropharynx is clear.  Eyes:     Extraocular Movements: Extraocular movements intact.     Conjunctiva/sclera: Conjunctivae normal.  Cardiovascular:  Rate and Rhythm: Regular rhythm. Tachycardia present.     Pulses: Normal pulses.  Pulmonary:     Effort: Pulmonary effort is normal. No respiratory distress.     Breath sounds: Normal breath sounds. No wheezing.  Abdominal:     General: Bowel sounds are normal. There is no distension.     Palpations: Abdomen is soft.     Tenderness: There is no abdominal tenderness. There is no guarding or rebound.  Musculoskeletal:        General: No swelling or tenderness.     Cervical back: Normal range of motion and neck supple.  Skin:     General: Skin is warm and dry.  Neurological:     General: No focal deficit present.     Mental Status: She is alert and oriented to person, place, and time.     Labs on Admission: I have personally reviewed following labs and imaging studies  CBC: Recent Labs  Lab 10/20/20 1814  WBC 17.5*  HGB 16.5*  HCT 48.5*  MCV 99.2  PLT 253   Basic Metabolic Panel: Recent Labs  Lab 10/20/20 1814  NA 137  K 3.5  CL 104  CO2 20*  GLUCOSE 123*  BUN 23*  CREATININE 2.40*  CALCIUM 9.4   GFR: Estimated Creatinine Clearance: 27.7 mL/min (A) (by C-G formula based on SCr of 2.4 mg/dL (H)). Liver Function Tests: Recent Labs  Lab 10/20/20 1814  AST 32  ALT 24  ALKPHOS 77  BILITOT 0.8  PROT 7.6  ALBUMIN 4.0   Recent Labs  Lab 10/20/20 1814  LIPASE 31   No results for input(s): AMMONIA in the last 168 hours. Coagulation Profile: No results for input(s): INR, PROTIME in the last 168 hours. Cardiac Enzymes: No results for input(s): CKTOTAL, CKMB, CKMBINDEX, TROPONINI in the last 168 hours. BNP (last 3 results) No results for input(s): PROBNP in the last 8760 hours. HbA1C: No results for input(s): HGBA1C in the last 72 hours. CBG: No results for input(s): GLUCAP in the last 168 hours. Lipid Profile: No results for input(s): CHOL, HDL, LDLCALC, TRIG, CHOLHDL, LDLDIRECT in the last 72 hours. Thyroid Function Tests: No results for input(s): TSH, T4TOTAL, FREET4, T3FREE, THYROIDAB in the last 72 hours. Anemia Panel: No results for input(s): VITAMINB12, FOLATE, FERRITIN, TIBC, IRON, RETICCTPCT in the last 72 hours. Urine analysis:    Component Value Date/Time   COLORURINE AMBER (A) 10/20/2020 1822   APPEARANCEUR CLOUDY (A) 10/20/2020 1822   LABSPEC 1.021 10/20/2020 1822   PHURINE 5.0 10/20/2020 1822   GLUCOSEU NEGATIVE 10/20/2020 1822   HGBUR SMALL (A) 10/20/2020 1822   BILIRUBINUR SMALL (A) 10/20/2020 1822   KETONESUR 5 (A) 10/20/2020 1822   PROTEINUR 30 (A) 10/20/2020  1822   NITRITE NEGATIVE 10/20/2020 1822   LEUKOCYTESUR NEGATIVE 10/20/2020 1822    Radiological Exams on Admission: CT ABDOMEN PELVIS WO CONTRAST  Result Date: 10/21/2020 CLINICAL DATA:  Upper abdominal pain, vomiting EXAM: CT ABDOMEN AND PELVIS WITHOUT CONTRAST TECHNIQUE: Multidetector CT imaging of the abdomen and pelvis was performed following the standard protocol without IV contrast. COMPARISON:  12/06/2016 FINDINGS: Lower chest: Lung bases are clear. No effusions. Heart is normal size. Hepatobiliary: No focal hepatic abnormality. Gallbladder unremarkable. Pancreas: No focal abnormality or ductal dilatation. Spleen: No focal abnormality.  Normal size. Adrenals/Urinary Tract: No adrenal abnormality. No focal renal abnormality. No stones or hydronephrosis. Urinary bladder is unremarkable. Stomach/Bowel: Diffuse colonic diverticulosis. No active diverticulitis. Normal appendix. Stomach and small bowel decompressed, unremarkable.  Vascular/Lymphatic: Aortic atherosclerosis. No evidence of aneurysm or adenopathy. Reproductive: Uterus and adnexa unremarkable.  No mass. Other: Moderate free fluid in the pelvis of unknown etiology. No free air. Musculoskeletal: No acute bony abnormality. IMPRESSION: Diffuse colonic diverticulosis.  No active diverticulitis. Moderate free fluid in the pelvis of unknown etiology. Aortic atherosclerosis. Electronically Signed   By: Charlett Nose M.D.   On: 10/21/2020 01:09   DG Chest Port 1 View  Result Date: 10/21/2020 CLINICAL DATA:  Cough EXAM: PORTABLE CHEST 1 VIEW COMPARISON:  None. FINDINGS: The heart size and mediastinal contours are within normal limits. Both lungs are clear. The visualized skeletal structures are unremarkable. IMPRESSION: No active disease. Electronically Signed   By: Helyn Numbers MD   On: 10/21/2020 00:47    EKG: Independently reviewed.  Sinus tachycardia, no prior tracing for comparison.  Assessment/Plan Principal Problem:   Abdominal  pain Active Problems:   Diarrhea   AKI (acute kidney injury) (HCC)   Metabolic acidosis   SIRS (systemic inflammatory response syndrome) (HCC)   Lower abdominal pain Diarrhea SIRS Presenting with complaints of bilateral lower quadrant abdominal pain and diarrhea.  She had nausea and vomiting 2 days ago which has now resolved.  Tachycardic and labs showing leukocytosis (WBC 17.5).  No fever.  Abdominal exam benign.  UA not strongly suggestive of infection.  CT abdomen pelvis showing diffuse colonic diverticulosis without active diverticulitis.  Moderate free fluid in the pelvis of unknown etiology.  Uterus and adnexa unremarkable; no mass. -Received 1 L fluid bolus in the ED, heart rate now improved.  Continue IV fluid hydration.  Continue Zosyn at this time.  Morphine as needed for pain.  Lactic acid level pending.  SARS-CoV-2 PCR test pending but patient is already vaccinated.  Order urine and blood cultures.  Continue to monitor WBC count.  Given ongoing diarrhea, check C. difficile PCR and GI pathogen panel.  Order pelvic ultrasound to assess for possible signs of malignancy given moderate amount of pelvic free fluid in a postmenopausal female.  AKI Likely prerenal azotemia from dehydration in the setting of poor oral intake and diarrhea.  BUN 23, creatinine 2.4.  Per care everywhere, creatinine was previously 0.8.  CT without signs of kidney stones or hydronephrosis. -IV fluid hydration.  Monitor renal function and urine output.  Avoid nephrotoxic agents.  Check urine sodium and creatinine  Mild normal anion gap metabolic acidosis Likely due to AKI.  Bicarb 20, anion gap 13. -Continue IV fluid hydration and monitor BMP  DVT prophylaxis: Subcutaneous heparin Code Status: Full code Family Communication: No family available at this time. Disposition Plan: Status is: Observation  The patient remains OBS appropriate and will d/c before 2 midnights.  Dispo: The patient is from: Home               Anticipated d/c is to: Home              Anticipated d/c date is: 2 days              Patient currently is not medically stable to d/c.  The medical decision making on this patient was of high complexity and the patient is at high risk for clinical deterioration, therefore this is a level 3 visit.  John Giovanni MD [facility]  If 7PM-7AM, please contact night-coverage [email or web address]  10/21/2020, 3:03 AM

## 2020-10-21 NOTE — Progress Notes (Signed)
Pharmacy Antibiotic Note  Elaine Martinez is a 59 y.o. female admitted on 10/20/2020 with presumed intra-abdominal infection.  Pharmacy has been consulted for Zosyn dosing.  Plan: Zosyn 3.375g IV q8h (4 hour infusion).  Height: 5\' 3"  (160 cm) Weight: 97.2 kg (214 lb 4.6 oz) (from 2020 records) IBW/kg (Calculated) : 52.4  Temp (24hrs), Avg:97.9 F (36.6 C), Min:97.9 F (36.6 C), Max:97.9 F (36.6 C)  Recent Labs  Lab 10/20/20 1814  WBC 17.5*  CREATININE 2.40*    Estimated Creatinine Clearance: 27.7 mL/min (A) (by C-G formula based on SCr of 2.4 mg/dL (H)).    No Known Allergies   Thank you for allowing pharmacy to be a part of this patients care.  13/02/21, PharmD, BCPS  10/21/2020 2:35 AM

## 2020-10-21 NOTE — ED Notes (Signed)
hospitalist at bedside

## 2020-10-21 NOTE — Progress Notes (Addendum)
[facility] PLAN OF CARE NOTE Patient: Elaine Martinez KGO:[id number]   PCP: Patient, No Pcp Per DOB: [birth year]   DOA: 10/20/2020   DOS: 10/21/2020    Patient was admitted by my colleague earlier on 10/21/2020. I have reviewed the H&P as well as assessment and plan and agree with the same. Important changes in the plan are listed below.  Plan of care: Principal Problem:   Abdominal pain Active Problems:   Diarrhea   AKI (acute kidney injury) (HCC)   Metabolic acidosis   SIRS (systemic inflammatory response syndrome) (HCC)    Renal function seems to have worsens.  infectious work up pending.  Advance to clears since no evidence of acute abnormality on CT abdominal.  Change zosyn to ceftriaxone plus flagyl. Low threshold to stop the Antibiotics. Suspect viral gastroenteritis.  Hypokalemia replace K. Continue LR.   Author: Lynden Oxford, MD [facility] 10/21/2020 7:19 AM   If 7PM-7AM, please contact night-coverage at [email or web address]

## 2020-10-21 NOTE — ED Notes (Signed)
Visitor at bedside.

## 2020-10-21 NOTE — ED Notes (Signed)
Bladder scan completed by NT, volume ; this nurse notified hospitalist Patel; No new orders given . Will continue to monitor.

## 2020-10-22 LAB — URINE CULTURE: Culture: <10000 — AB

## 2020-10-22 LAB — CBC
HCT: 34.9 % — ABNORMAL LOW (ref 36.0–46.0)
Hemoglobin: 11.6 g/dL — ABNORMAL LOW (ref 12.0–15.0)
MCH: 33.5 pg (ref 26.0–34.0)
MCHC: 33.2 g/dL (ref 30.0–36.0)
MCV: 100.9 fL — ABNORMAL HIGH (ref 80.0–100.0)
Platelets: 166 10*3/uL (ref 150–400)
RBC: 3.46 MIL/uL — ABNORMAL LOW (ref 3.87–5.11)
RDW: 12.2 % (ref 11.5–15.5)
WBC: 7.9 10*3/uL (ref 4.0–10.5)
nRBC: 0 % (ref 0.0–0.2)

## 2020-10-22 LAB — RENAL FUNCTION PANEL
Albumin: 2.9 g/dL — ABNORMAL LOW (ref 3.5–5.0)
Anion gap: 9 (ref 5–15)
BUN: 14 mg/dL (ref 6–20)
CO2: 20 mmol/L — ABNORMAL LOW (ref 22–32)
Calcium: 8.4 mg/dL — ABNORMAL LOW (ref 8.9–10.3)
Chloride: 109 mmol/L (ref 98–111)
Creatinine, Ser: 1.12 mg/dL — ABNORMAL HIGH (ref 0.44–1.00)
GFR, Estimated: 56 mL/min — ABNORMAL LOW (ref >60)
Glucose, Bld: 92 mg/dL (ref 70–99)
Phosphorus: 3 mg/dL (ref 2.5–4.6)
Potassium: 3.8 mmol/L (ref 3.5–5.1)
Sodium: 138 mmol/L (ref 135–145)

## 2020-10-22 LAB — C DIFFICILE QUICK SCREEN W PCR REFLEX
C Diff antigen: NEGATIVE
C Diff interpretation: NOT DETECTED
C Diff toxin: NEGATIVE

## 2020-10-22 LAB — MAGNESIUM: Magnesium: 1.8 mg/dL (ref 1.7–2.4)

## 2020-10-22 MED ORDER — METRONIDAZOLE 500 MG PO TABS: 500.0000 mg | ORAL_TABLET | Freq: Three times a day (TID) | ORAL | Status: DC

## 2020-10-22 MED ORDER — SIMETHICONE 80 MG PO CHEW: 80.0000 mg | CHEWABLE_TABLET | Freq: Four times a day (QID) | ORAL | 0 refills | Status: DC | PRN

## 2020-10-22 NOTE — Progress Notes (Signed)
Patient discharged to home with instructions. 

## 2020-10-23 LAB — GASTROINTESTINAL PANEL BY PCR, STOOL (REPLACES STOOL CULTURE)

## 2020-10-26 LAB — CULTURE, BLOOD (ROUTINE X 2)
Culture: NO GROWTH
Culture: NO GROWTH
Special Requests: ADEQUATE
Special Requests: ADEQUATE

## 2020-10-26 NOTE — Discharge Summary (Signed)
[facility] Discharge Summary   Patient: Elaine Martinez WGN:[id number]  PCP: Patient, No Pcp Per  Date of admission: 10/20/2020   Date of discharge: 10/22/2020     Discharge Diagnoses:  Principal diagnosis Gastroenteritis   Principal Problem:   Abdominal pain Active Problems:   Diarrhea   AKI (acute kidney injury) (HCC)   Metabolic acidosis   SIRS (systemic inflammatory response syndrome) (HCC)   Admitted From: home Disposition:  Home   Recommendations for Outpatient Follow-up:  1. PCP: follow up with PCP in 2 weeks  2. Follow up LABS/TEST:  Repeat CBC and BMP in 1 week   Follow-up Information    PCP. Schedule an appointment as soon as possible for a visit in 1 week(s).              Diet recommendation: Cardiac diet  Activity: The patient is advised to gradually reintroduce usual activities, as tolerated  Discharge Condition: stable  Code Status: Full code   History of present illness: As per the H and P dictated on admission, " Elaine Martinez is a 59 y.o. female with medical history significant of hypertension, depression, obesity presenting to the ED with complaints of lower abdominal pain and diarrhea.  Patient reports 2-day history of bilateral lower quadrant abdominal pain which is sharp, constant, and radiates to her left upper quadrant.  States it feels like a "ball" in her abdomen.  She had vomiting 2 days ago which has now resolved.  She is no longer nauseous.  However, she continues to have nonbloody watery diarrhea which was initially brown but now appears green.  Denies fevers.  Denies recent antibiotic use.  She is currently sexually active with her husband.  Denies dyspareunia or noticing any abnormal vaginal discharge.  Denies dysuria.  States she has been urinating more frequently for the past 1 month but has also been drinking more water.  States she had abdominal hernia mesh repair surgery done twice in the past over a decade ago.  Patient has no other  complaints.  States she has been vaccinated against Covid.  Denies cough, shortness of breath, or chest pain."  Hospital Course:  Summary of her active problems in the hospital is as following.   Lower abdominal pain Diarrhea SIRS Presenting with complaints of bilateral lower quadrant abdominal pain and diarrhea.   She had nausea and vomiting 2 days ago which has now resolved. Tachycardic and labs showing leukocytosis (WBC 17.5).   No fever.   CT abdomen pelvis showing diffuse colonic diverticulosis without active diverticulitis.   Moderate free fluid in the pelvis of unknown etiology.  Uterus and adnexa unremarkable; no mass.  Treated with IV fluid and Zosyn Negative C. difficile PCR and GI pathogen panel.   Unremarkable pelvic ultrasound Nausea resolved, no vomiting,  no diarrhea pt tolerating oral diet.  No further work up needed.   AKI Likely prerenal azotemia from dehydration in the setting of poor oral intake and diarrhea.  BUN 23, creatinine 2.4.   Per care everywhere, creatinine was previously 0.8.   CT without signs of kidney stones or hydronephrosis. Resolved with IV fluid hydration. Holding BP meds   Mild normal anion gap metabolic acidosis Likely due to AKI.  Bicarb 20, anion gap 13. Resolved   Obesity  outpt follow up with PCP  essential HTN BP normal.  Holding lisinopril and HCTZ due to AKI and normal BP  Dilutional anemia  No bleeding reported.  CT abdominal shows some fluid but density no  consistent with blood. No further inpatient work up, recommended repeat CBC in 1 week with PCP.   Patient was ambulatory without any assistance. On the day of the discharge the patient's vitals were stable, and no other acute medical condition were reported by patient. The patient was felt safe to be discharge at Home with no therapy needed on discharge.  Consultants: none Procedures: noen  Discharge Exam: General: Appear in no distress, no Rash; Oral Mucosa Clear,  moist. no Abnormal Neck Mass Or lumps, Conjunctiva normal  Cardiovascular: S1 and S2 Present, no Murmur Respiratory: good respiratory effort, Bilateral Air entry present and CTA, no Crackles, no wheezes Abdomen: Bowel Sound present, Soft and no tenderness Extremities: no Pedal edema Neurology: alert and oriented to time, place, and person affect appropriate. no new focal deficit  Filed Weights   10/21/20 0221  Weight: 97.2 kg   Vitals:   10/22/20 0510 10/22/20 1457  BP: 113/70 (!) 145/82  Pulse: 83 98  Resp: 18 17  Temp: 98 F (36.7 C) 98.4 F (36.9 C)  SpO2: 99% 100%    DISCHARGE MEDICATION: Allergies as of 10/22/2020   No Known Allergies     Medication List    STOP taking these medications   ibuprofen 600 MG tablet Commonly known as: ADVIL   lisinopril-hydrochlorothiazide 20-25 MG tablet Commonly known as: ZESTORETIC     TAKE these medications   aspirin 81 MG EC tablet Take 81 mg by mouth daily.   Biotin 10 MG Caps Take 10 mg by mouth daily.   clobetasol ointment 0.05 % Commonly known as: TEMOVATE Apply 1 application topically at bedtime as needed for rash.   famotidine 20 MG tablet Commonly known as: PEPCID Take 20 mg by mouth 2 (two) times daily as needed for indigestion.   Multi-Vitamin tablet Take 1 tablet by mouth daily.   Saxenda 18 MG/3ML Sopn Generic drug: Liraglutide -Weight Management Inject 2.4 mg into the skin daily.   simethicone 80 MG chewable tablet Commonly known as: MYLICON Chew 1 tablet (80 mg total) by mouth 4 (four) times daily as needed for flatulence.      No Known Allergies Discharge Instructions    Diet - low sodium heart healthy   Complete by: As directed    Increase activity slowly   Complete by: As directed       The results of significant diagnostics from this hospitalization (including imaging, microbiology, ancillary and laboratory) are listed below for reference.    Significant Diagnostic Studies: CT ABDOMEN  PELVIS WO CONTRAST  Result Date: 10/21/2020 CLINICAL DATA:  Upper abdominal pain, vomiting EXAM: CT ABDOMEN AND PELVIS WITHOUT CONTRAST TECHNIQUE: Multidetector CT imaging of the abdomen and pelvis was performed following the standard protocol without IV contrast. COMPARISON:  12/06/2016 FINDINGS: Lower chest: Lung bases are clear. No effusions. Heart is normal size. Hepatobiliary: No focal hepatic abnormality. Gallbladder unremarkable. Pancreas: No focal abnormality or ductal dilatation. Spleen: No focal abnormality.  Normal size. Adrenals/Urinary Tract: No adrenal abnormality. No focal renal abnormality. No stones or hydronephrosis. Urinary bladder is unremarkable. Stomach/Bowel: Diffuse colonic diverticulosis. No active diverticulitis. Normal appendix. Stomach and small bowel decompressed, unremarkable. Vascular/Lymphatic: Aortic atherosclerosis. No evidence of aneurysm or adenopathy. Reproductive: Uterus and adnexa unremarkable.  No mass. Other: Moderate free fluid in the pelvis of unknown etiology. No free air. Musculoskeletal: No acute bony abnormality. IMPRESSION: Diffuse colonic diverticulosis.  No active diverticulitis. Moderate free fluid in the pelvis of unknown etiology. Aortic atherosclerosis. Electronically Signed   By:  Charlett Nose M.D.   On: 10/21/2020 01:09   DG Chest Port 1 View  Result Date: 10/21/2020 CLINICAL DATA:  Cough EXAM: PORTABLE CHEST 1 VIEW COMPARISON:  None. FINDINGS: The heart size and mediastinal contours are within normal limits. Both lungs are clear. The visualized skeletal structures are unremarkable. IMPRESSION: No active disease. Electronically Signed   By: Helyn Numbers MD   On: 10/21/2020 00:47   US PELVIC COMPLETE WITH TRANSVAGINAL  Result Date: 10/21/2020 CLINICAL DATA:  Free fluid in the pelvis EXAM: TRANSABDOMINAL AND TRANSVAGINAL ULTRASOUND OF PELVIS TECHNIQUE: Both transabdominal and transvaginal ultrasound examinations of the pelvis were performed.  Transabdominal technique was performed for global imaging of the pelvis including uterus, ovaries, adnexal regions, and pelvic cul-de-sac. It was necessary to proceed with endovaginal exam following the transabdominal exam to visualize the uterus, endometrium, ovaries and adnexa. COMPARISON:  CT earlier today FINDINGS: Uterus Measurements: 6.0 x 3.2 x 4.1 cm = volume: 40 mL. No fibroids or other mass visualized. Endometrium Thickness: 7 mm in thickness.  No focal abnormality visualized. Right ovary Measurements: Not visualized.  No adnexal mass seen. Left ovary Measurements: Not visualized.  No adnexal mass seen. Other findings Small to moderate free fluid in the pelvis. IMPRESSION: Neither ovary visualized.  No adnexal mass seen. Small to moderate free fluid. Electronically Signed   By: Charlett Nose M.D.   On: 10/21/2020 03:45    Microbiology: Recent Results (from the past 240 hour(s))  Respiratory Panel by RT PCR (Flu A&B, Covid) - Nasopharyngeal Swab     Status: None   Collection Time: 10/21/20 12:47 AM   Specimen: Nasopharyngeal Swab  Result Value Ref Range Status   SARS Coronavirus 2 by RT PCR NEGATIVE NEGATIVE Final    Comment: (NOTE) SARS-CoV-2 target nucleic acids are NOT DETECTED.  The SARS-CoV-2 RNA is generally detectable in upper respiratoy specimens during the acute phase of infection. The lowest concentration of SARS-CoV-2 viral copies this assay can detect is 131 copies/mL. A negative result does not preclude SARS-Cov-2 infection and should not be used as the sole basis for treatment or other patient management decisions. A negative result may occur with  improper specimen collection/handling, submission of specimen other than nasopharyngeal swab, presence of viral mutation(s) within the areas targeted by this assay, and inadequate number of viral copies (<131 copies/mL). A negative result must be combined with clinical observations, patient history, and epidemiological  information. The expected result is Negative.  Fact Sheet for Patients:  https://www.moore.com/  Fact Sheet for Healthcare Providers:  https://www.young.biz/  This test is no t yet approved or cleared by the [place] FDA and  has been authorized for detection and/or diagnosis of SARS-CoV-2 by FDA under an Emergency Use Authorization (EUA). This EUA will remain  in effect (meaning this test can be used) for the duration of the COVID-19 declaration under Section 564(b)(1) of the Act, 21 U.S.C. section 360bbb-3(b)(1), unless the authorization is terminated or revoked sooner.     Influenza A by PCR NEGATIVE NEGATIVE Final   Influenza B by PCR NEGATIVE NEGATIVE Final    Comment: (NOTE) The Xpert Xpress SARS-CoV-2/FLU/RSV assay is intended as an aid in  the diagnosis of influenza from Nasopharyngeal swab specimens and  should not be used as a sole basis for treatment. Nasal washings and  aspirates are unacceptable for Xpert Xpress SARS-CoV-2/FLU/RSV  testing.  Fact Sheet for Patients: https://www.moore.com/  Fact Sheet for Healthcare Providers: https://www.young.biz/  This test is not yet approved or  cleared by the [place] and  has been authorized for detection and/or diagnosis of SARS-CoV-2 by  FDA under an Emergency Use Authorization (EUA). This EUA will remain  in effect (meaning this test can be used) for the duration of the  Covid-19 declaration under Section 564(b)(1) of the Act, 21  U.S.C. section 360bbb-3(b)(1), unless the authorization is  terminated or revoked. Performed at [facility], [address] [place], [place] [address]   Culture, blood (routine x 2)     Status: None (Preliminary result)   Collection Time: 10/21/20  2:37 AM   Specimen: BLOOD  Result Value Ref Range Status   Specimen Description BLOOD SITE NOT SPECIFIED  Final   Special Requests   Final     BOTTLES DRAWN AEROBIC AND ANAEROBIC Blood Culture adequate volume   Culture   Final    NO GROWTH 4 DAYS Performed at [facility], [address] [place], [place] [address]    Report Status PENDING  Incomplete  Culture, blood (routine x 2)     Status: None (Preliminary result)   Collection Time: 10/21/20  2:46 AM   Specimen: BLOOD LEFT HAND  Result Value Ref Range Status   Specimen Description BLOOD LEFT HAND  Final   Special Requests   Final    BOTTLES DRAWN AEROBIC AND ANAEROBIC Blood Culture adequate volume   Culture   Final    NO GROWTH 4 DAYS Performed at [facility], [address], [place], [place] [address]    Report Status PENDING  Incomplete  Urine Culture     Status: Abnormal   Collection Time: 10/21/20 10:49 AM   Specimen: Urine, Random  Result Value Ref Range Status   Specimen Description URINE, RANDOM  Final   Special Requests NONE  Final   Culture (A)  Final    <10,000 COLONIES/mL INSIGNIFICANT GROWTH Performed at [facility], [address], [place], [place] [address]    Report Status 10/22/2020 FINAL  Final  C Difficile Quick Screen w PCR reflex     Status: None   Collection Time: 10/22/20 10:20 AM   Specimen: Rectum; Stool  Result Value Ref Range Status   C Diff antigen NEGATIVE NEGATIVE Final   C Diff toxin NEGATIVE NEGATIVE Final   C Diff interpretation No C. difficile detected.  Final    Comment: Performed at [facility], [address], [place], [place] [address]  Gastrointestinal Panel by PCR , Stool     Status: None   Collection Time: 10/22/20 10:20 AM   Specimen: Rectum; Stool  Result Value Ref Range Status   Campylobacter species NOT DETECTED NOT DETECTED Final   Plesimonas shigelloides NOT DETECTED NOT DETECTED Final   Salmonella species NOT DETECTED NOT DETECTED Final   Yersinia enterocolitica NOT DETECTED NOT DETECTED Final   Vibrio species NOT DETECTED NOT DETECTED Final   Vibrio cholerae NOT DETECTED NOT DETECTED  Final   Enteroaggregative E coli (EAEC) NOT DETECTED NOT DETECTED Final   Enteropathogenic E coli (EPEC) NOT DETECTED NOT DETECTED Final   Enterotoxigenic E coli (ETEC) NOT DETECTED NOT DETECTED Final   Shiga like toxin producing E coli (STEC) NOT DETECTED NOT DETECTED Final   Shigella/Enteroinvasive E coli (EIEC) NOT DETECTED NOT DETECTED Final   Cryptosporidium NOT DETECTED NOT DETECTED Final   Cyclospora cayetanensis NOT DETECTED NOT DETECTED Final   Entamoeba histolytica NOT DETECTED NOT DETECTED Final   Giardia lamblia NOT  DETECTED NOT DETECTED Final   Adenovirus F40/41 NOT DETECTED NOT DETECTED Final   Astrovirus NOT DETECTED NOT DETECTED Final   Norovirus GI/GII NOT DETECTED NOT DETECTED Final   Rotavirus A NOT DETECTED NOT DETECTED Final   Sapovirus (I, II, IV, and V) NOT DETECTED NOT DETECTED Final    Comment: Performed at [facility], [address] [place], [place] [address]     Labs: CBC: Recent Labs  Lab 10/20/20 1814 10/21/20 0255 10/22/20 0058  WBC 17.5* 14.0* 7.9  HGB 16.5* 14.4 11.6*  HCT 48.5* 43.0 34.9*  MCV 99.2 101.4* 100.9*  PLT 253 203 166   Basic Metabolic Panel: Recent Labs  Lab 10/20/20 1814 10/21/20 0255 10/22/20 0058  NA 137 138 138  K 3.5 3.3* 3.8  CL 104 105 109  CO2 20* 20* 20*  GLUCOSE 123* 100* 92  BUN 23* 26* 14  CREATININE 2.40* 2.76* 1.12*  CALCIUM 9.4 8.5* 8.4*  MG  --   --  1.8  PHOS  --   --  3.0   Liver Function Tests: Recent Labs  Lab 10/20/20 1814 10/22/20 0058  AST 32  --   ALT 24  --   ALKPHOS 77  --   BILITOT 0.8  --   PROT 7.6  --   ALBUMIN 4.0 2.9*   CBG: No results for input(s): GLUCAP in the last 168 hours.  Time spent: 35 minutes  Signed:  Lynden Oxford  [facility] 10/22/2020 7:50 AM

## 2022-01-05 IMAGING — CT CT ABD-PELV W/O CM
2 of 4 series · 16 of 46 positions shown, 18 images · non-contrast
Comparison: 12/06/2016

CLINICAL DATA: Upper abdominal pain, vomiting

EXAM:
CT ABDOMEN AND PELVIS WITHOUT CONTRAST
TECHNIQUE: Multidetector CT imaging of the abdomen and pelvis was performed
following the standard protocol without IV contrast.

[Series 3: a/p w/o 5mm · axial · non-contrast · 0.88mm/px · z∈[+740,+1200]mm · 13 of 100 slices shown, 15 images]
[im 4/100  soft-tissue]
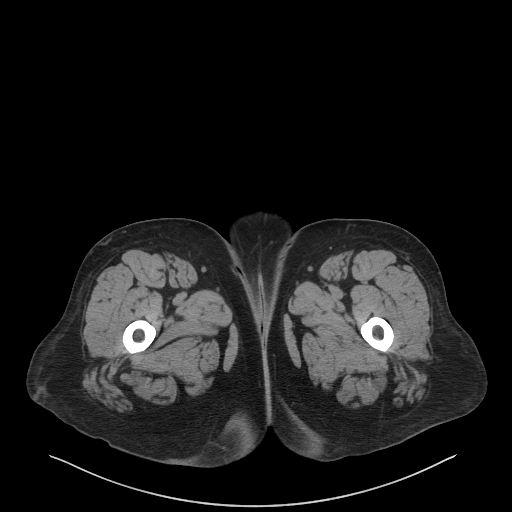
[im 4/100  bone]
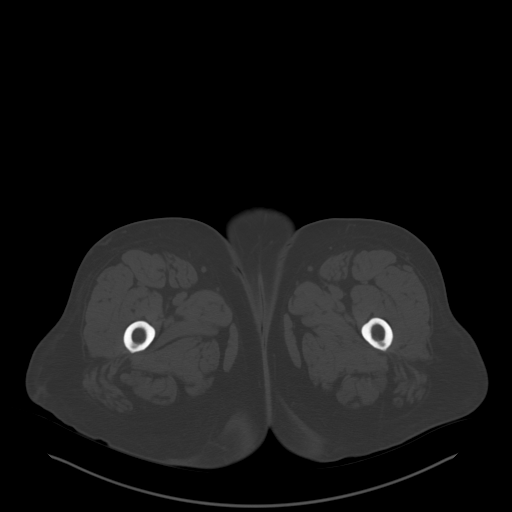
[im 12/100  soft-tissue]
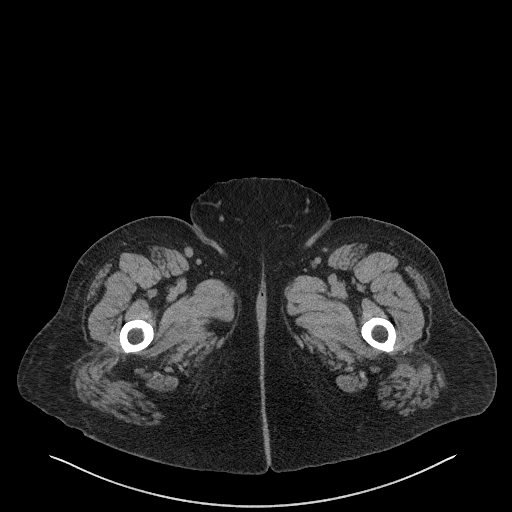
[im 20/100  soft-tissue]
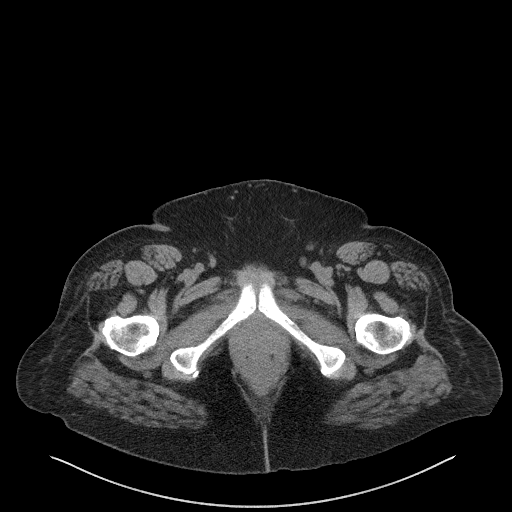
[im 27/100  soft-tissue]
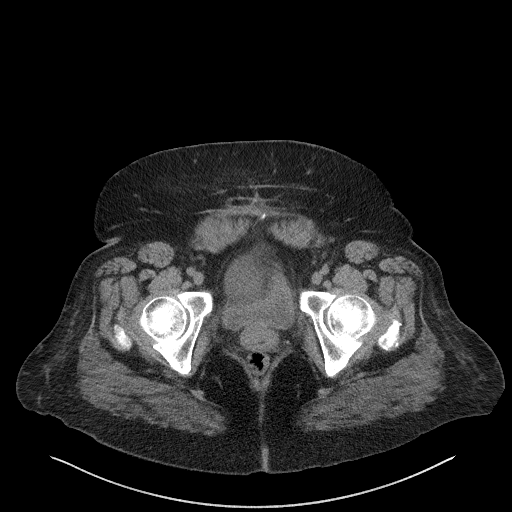
[im 35/100  soft-tissue]
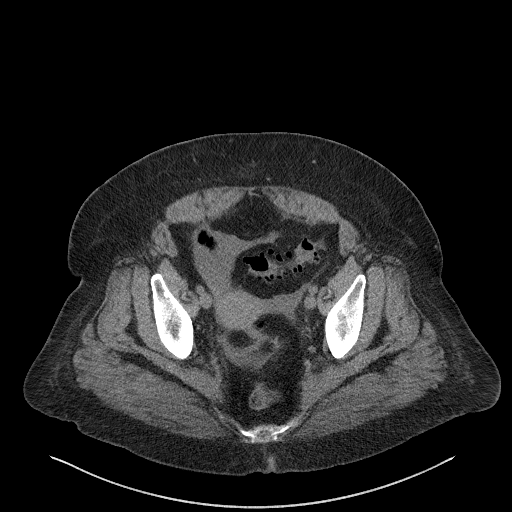
[im 42/100  soft-tissue]
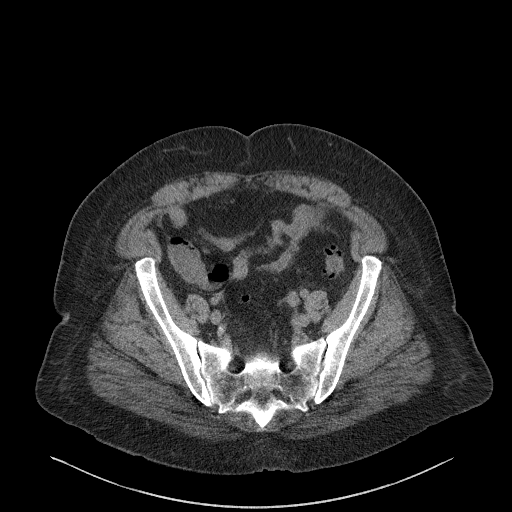
[im 50/100  soft-tissue]
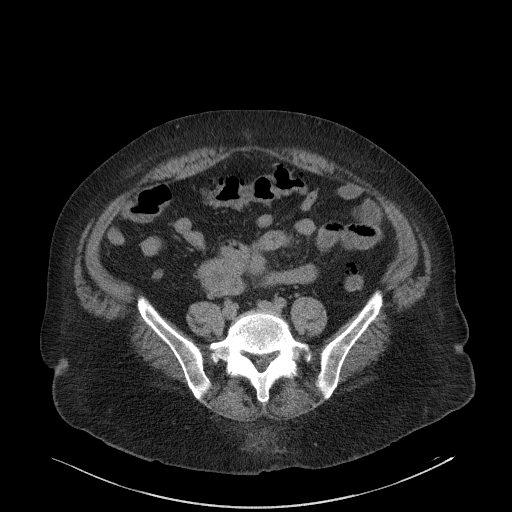
[im 58/100  soft-tissue]
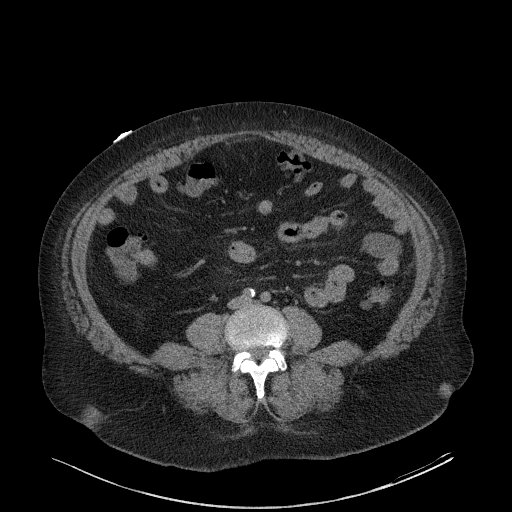
[im 65/100  soft-tissue]
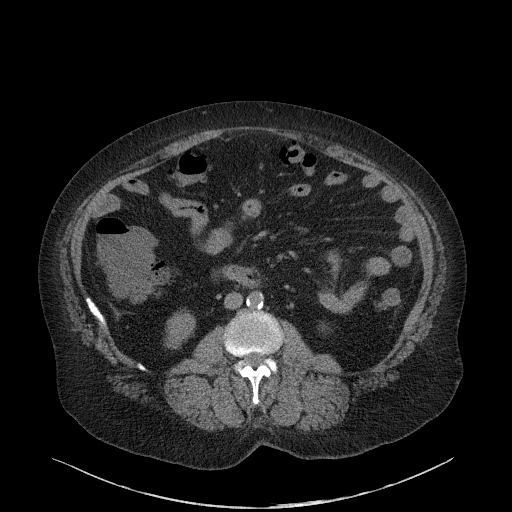
[im 65/100  bone]
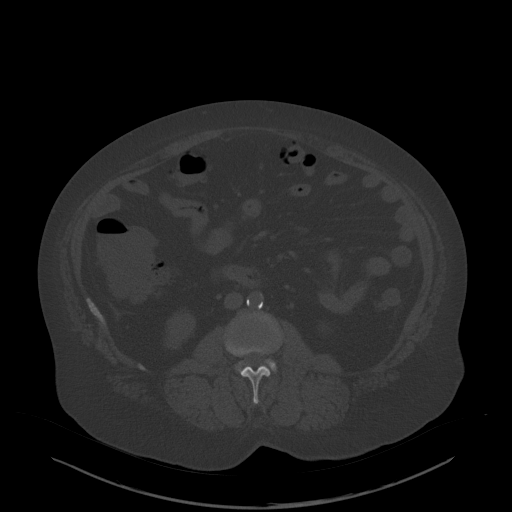
[im 73/100  soft-tissue]
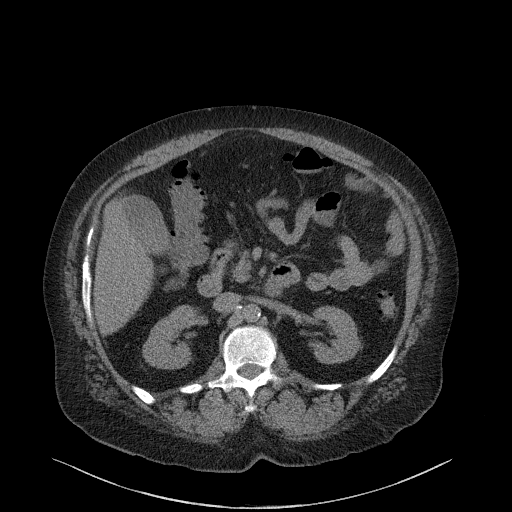
[im 80/100  soft-tissue]
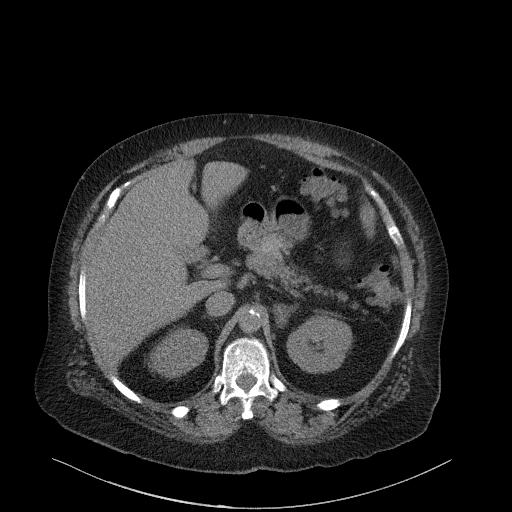
[im 88/100  soft-tissue]
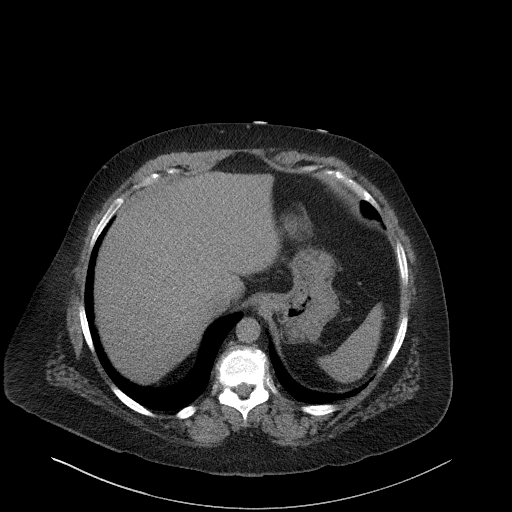
[im 96/100  soft-tissue]
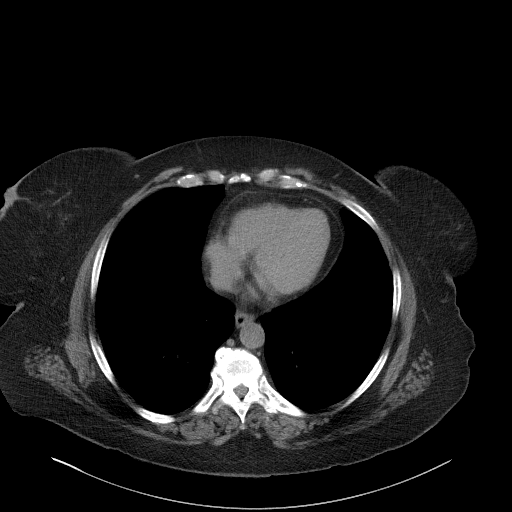

[Series 6: a/p w/o cor · coronal · non-contrast · 0.80mm/px · 3 of 149 slices shown]
[im 50/149  soft-tissue]
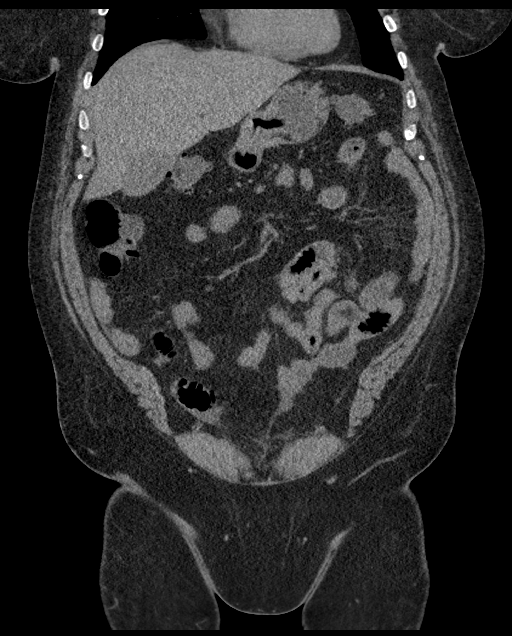
[im 66/149  soft-tissue]
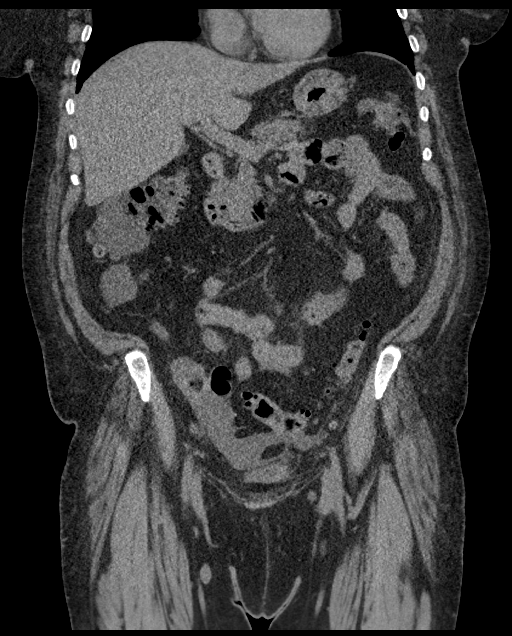
[im 83/149  soft-tissue]
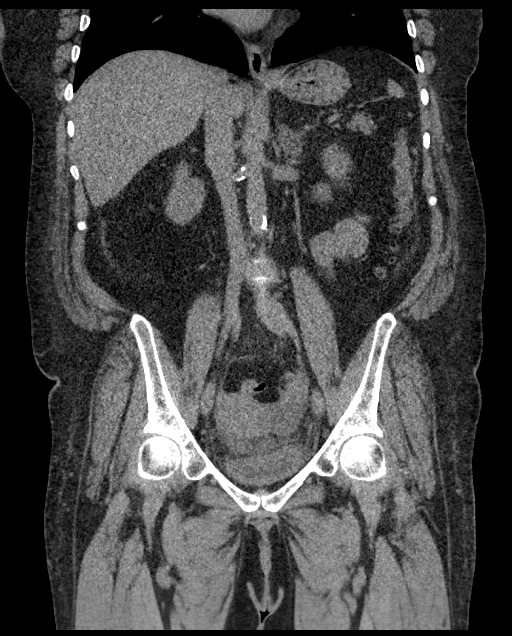

[16 of 46 positions shown; findings below may reference images not displayed]

FINDINGS: Lower chest: Lung bases are clear. No effusions. Heart is normal
size.

Hepatobiliary: No focal hepatic abnormality. Gallbladder
unremarkable.

Pancreas: No focal abnormality or ductal dilatation.

Spleen: No focal abnormality.  Normal size.

Adrenals/Urinary Tract: No adrenal abnormality. No focal renal
abnormality. No stones or hydronephrosis. Urinary bladder is
unremarkable.

Stomach/Bowel: Diffuse colonic diverticulosis. No active
diverticulitis. Normal appendix. Stomach and small bowel
decompressed, unremarkable.

Vascular/Lymphatic: Aortic atherosclerosis. No evidence of aneurysm
or adenopathy.

Reproductive: Uterus and adnexa unremarkable.  No mass.

Other: Moderate free fluid in the pelvis of unknown etiology. No
free air.

Musculoskeletal: No acute bony abnormality.
IMPRESSION: Diffuse colonic diverticulosis.  No active diverticulitis.

Moderate free fluid in the pelvis of unknown etiology.

Aortic atherosclerosis.

## 2022-01-05 IMAGING — DX DG CHEST 1V PORT
1 series · 1 of 1 positions shown · non-contrast
Comparison: None.

CLINICAL DATA: Cough

EXAM:
PORTABLE CHEST 1 VIEW

[chest ap]
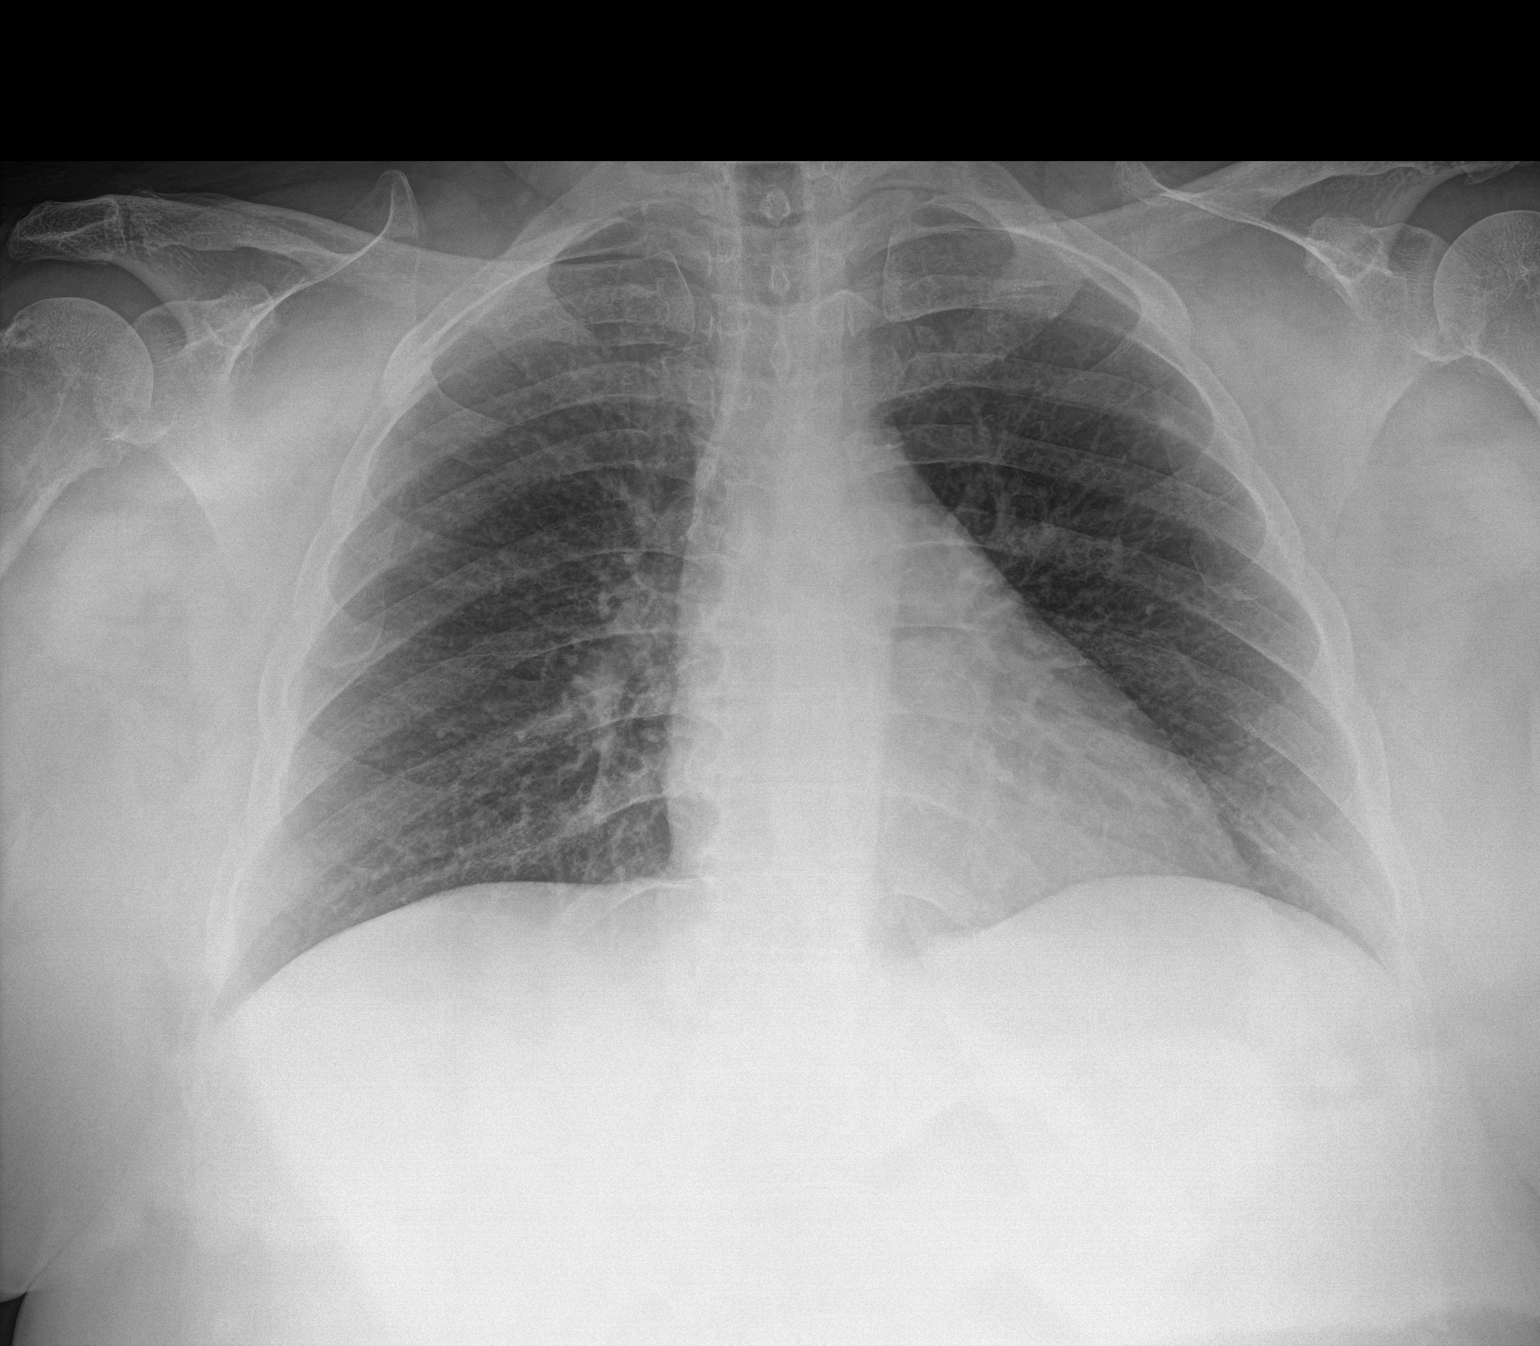

[1 of 1 positions shown; findings below may reference images not displayed]

FINDINGS: The heart size and mediastinal contours are within normal limits.
Both lungs are clear. The visualized skeletal structures are
unremarkable.
IMPRESSION: No active disease.

## 2022-10-17 ENCOUNTER — Emergency Department (HOSPITAL_COMMUNITY)
Admission: EM | Admit: 2022-10-17 | Discharge: 2022-10-17 | Disposition: A | Discharge status: Home / Self Care | Payer: BLUE CROSS/BLUE SHIELD | Attending: Emergency Medicine | Admitting: Emergency Medicine

## 2022-10-17 ENCOUNTER — Encounter (HOSPITAL_COMMUNITY): Payer: Self-pay | Admitting: *Deleted

## 2022-10-17 ENCOUNTER — Emergency Department (HOSPITAL_COMMUNITY): Payer: BLUE CROSS/BLUE SHIELD

## 2022-10-17 ENCOUNTER — Other Ambulatory Visit: Payer: Self-pay

## 2022-10-17 DIAGNOSIS — Z91199 Patient's noncompliance with other medical treatment and regimen due to unspecified reason: Secondary | ICD-10-CM | POA: Insufficient documentation

## 2022-10-17 DIAGNOSIS — R000 Tachycardia, unspecified: Secondary | ICD-10-CM | POA: Insufficient documentation

## 2022-10-17 DIAGNOSIS — R002 Palpitations: Secondary | ICD-10-CM | POA: Insufficient documentation

## 2022-10-17 DIAGNOSIS — R110 Nausea: Secondary | ICD-10-CM | POA: Diagnosis not present

## 2022-10-17 DIAGNOSIS — R0789 Other chest pain: Secondary | ICD-10-CM | POA: Insufficient documentation

## 2022-10-17 DIAGNOSIS — Z7982 Long term (current) use of aspirin: Secondary | ICD-10-CM | POA: Insufficient documentation

## 2022-10-17 LAB — CBC WITH DIFFERENTIAL/PLATELET
Abs Immature Granulocytes: 0.09 10*3/uL — ABNORMAL HIGH (ref 0.00–0.07)
Basophils Absolute: 0 10*3/uL (ref 0.0–0.1)
Basophils Relative: 0 %
Eosinophils Absolute: 0.2 10*3/uL (ref 0.0–0.5)
Eosinophils Relative: 1 %
HCT: 39.2 % (ref 36.0–46.0)
Hemoglobin: 13.6 g/dL (ref 12.0–15.0)
Immature Granulocytes: 1 %
Lymphocytes Relative: 11 %
Lymphs Abs: 1.6 10*3/uL (ref 0.7–4.0)
MCH: 33.9 pg (ref 26.0–34.0)
MCHC: 34.7 g/dL (ref 30.0–36.0)
MCV: 97.8 fL (ref 80.0–100.0)
Monocytes Absolute: 1.3 10*3/uL — ABNORMAL HIGH (ref 0.1–1.0)
Monocytes Relative: 9 %
Neutro Abs: 11.7 10*3/uL — ABNORMAL HIGH (ref 1.7–7.7)
Neutrophils Relative %: 78 %
Platelets: 171 10*3/uL (ref 150–400)
RBC: 4.01 MIL/uL (ref 3.87–5.11)
RDW: 12 % (ref 11.5–15.5)
WBC: 14.9 10*3/uL — ABNORMAL HIGH (ref 4.0–10.5)
nRBC: 0 % (ref 0.0–0.2)

## 2022-10-17 LAB — LIPASE, BLOOD: Lipase: 64 U/L — ABNORMAL HIGH (ref 11–51)

## 2022-10-17 LAB — COMPREHENSIVE METABOLIC PANEL
ALT: 47 U/L — ABNORMAL HIGH (ref 0–44)
AST: 63 U/L — ABNORMAL HIGH (ref 15–41)
Albumin: 3.6 g/dL (ref 3.5–5.0)
Alkaline Phosphatase: 96 U/L (ref 38–126)
Anion gap: 16 — ABNORMAL HIGH (ref 5–15)
BUN: 20 mg/dL (ref 8–23)
CO2: 22 mmol/L (ref 22–32)
Calcium: 9.7 mg/dL (ref 8.9–10.3)
Chloride: 101 mmol/L (ref 98–111)
Creatinine, Ser: 1.8 mg/dL — ABNORMAL HIGH (ref 0.44–1.00)
GFR, Estimated: 31 mL/min — ABNORMAL LOW (ref >60)
Glucose, Bld: 102 mg/dL — ABNORMAL HIGH (ref 70–99)
Potassium: 3.8 mmol/L (ref 3.5–5.1)
Sodium: 139 mmol/L (ref 135–145)
Total Bilirubin: 1.2 mg/dL (ref 0.3–1.2)
Total Protein: 6.8 g/dL (ref 6.5–8.1)

## 2022-10-17 LAB — TROPONIN I (HIGH SENSITIVITY)
Troponin I (High Sensitivity): 16 ng/L (ref <18)
Troponin I (High Sensitivity): 14 ng/L (ref <18)

## 2022-10-17 LAB — D-DIMER, QUANTITATIVE: D-Dimer, Quant: 1.6 ug/mL-FEU — ABNORMAL HIGH (ref 0.00–0.50)

## 2022-10-17 MED ORDER — LISINOPRIL-HYDROCHLOROTHIAZIDE 20-25 MG PO TABS: 1.0000 | ORAL_TABLET | Freq: Every day | ORAL | 1 refills | Status: AC

## 2022-10-17 MED ORDER — HYDROCHLOROTHIAZIDE 25 MG PO TABS
25.0000 mg | ORAL_TABLET | Freq: Every day | ORAL | Status: DC
  Administered 2022-10-17: 25 mg via ORAL
  Filled 2022-10-17: qty 1

## 2022-10-17 MED ORDER — IOHEXOL 350 MG/ML SOLN
50.0000 mL | Freq: Once | INTRAVENOUS | Status: AC | PRN
  Administered 2022-10-17: 50 mL via INTRAVENOUS

## 2022-10-17 MED ORDER — CLOBETASOL PROPIONATE 0.05 % EX OINT: 1.0000 | TOPICAL_OINTMENT | Freq: Every evening | CUTANEOUS | 0 refills | Status: DC | PRN

## 2022-10-17 MED ORDER — CLONAZEPAM 0.5 MG PO TABS
0.5000 mg | ORAL_TABLET | Freq: Once | ORAL | Status: AC
  Administered 2022-10-17: 0.5 mg via ORAL
  Filled 2022-10-17: qty 1

## 2022-10-17 MED ORDER — LISINOPRIL 10 MG PO TABS
10.0000 mg | ORAL_TABLET | Freq: Once | ORAL | Status: AC
  Administered 2022-10-17: 10 mg via ORAL
  Filled 2022-10-17: qty 1

## 2022-10-17 MED ORDER — PANTOPRAZOLE SODIUM 40 MG PO TBEC
40.0000 mg | DELAYED_RELEASE_TABLET | Freq: Once | ORAL | Status: AC
  Administered 2022-10-17: 40 mg via ORAL
  Filled 2022-10-17: qty 1

## 2022-10-17 MED ORDER — ALUM & MAG HYDROXIDE-SIMETH 200-200-20 MG/5ML PO SUSP
30.0000 mL | Freq: Once | ORAL | Status: AC
  Administered 2022-10-17: 30 mL via ORAL
  Filled 2022-10-17: qty 30

## 2022-10-17 MED ORDER — DICYCLOMINE HCL 10 MG/5ML PO SOLN
10.0000 mg | Freq: Once | ORAL | Status: AC
  Administered 2022-10-17: 10 mg via ORAL
  Filled 2022-10-17: qty 5

## 2022-10-17 NOTE — Discharge Instructions (Addendum)
Your CT scan shows findings concerning for possible pulmonary hypertension, please follow up with pulmonologist as noted above  Return to the Emergency Department if you have unusual chest pain, pressure, or discomfort, shortness of breath, nausea, vomiting, burping, heartburn, tingling upper body parts, sweating, cold, clammy skin, or racing heartbeat. Call 911 if you think you are having a heart attack. Take all cardiac medications as prescribed - notify your doctor if you have any side effects. Follow cardiac diet - avoid fatty & fried foods, don't eat too much red meat, eat lots of fruits & vegetables, and dairy products should be low fat. Please lose weight if you are overweight. Become more active with walking, gardening, or any other activity that gets you to moving.   Please return to the emergency department immediately for any new or concerning symptoms, or if you get worse.

## 2022-10-17 NOTE — ED Triage Notes (Signed)
Patient presents to ed via [facility] from work c/o sharp epigastric pain onset earlier tonight. States she didn't take her meds yest , no reason just didn't take them. C/o increased stress in lift and is drinking more alcohol, states the pain comes and goes currently painfree.

## 2022-10-17 NOTE — ED Provider Notes (Signed)
Provider Note MRN:  [id number]  Arrival date & time: 10/17/22    ED Course and Medical Decision Making  Assumed care from [place] at shift change.  See note from prior team for complete details, in brief:  61 yo female Here w/ cp Takes medications sporadically Tachy/tachyp/dimer + WBC 14.9 Labs o/w stable  Plan per prior physician CT pending  CT reviewed and is stable, no PE or PNA. CT with findings c/w ?pulmonary htn. She denies hx of this. No dyspnea on exam. Will give pulm f/u Her bp and HR have improved after taking her home medications She also had tachy on her prior EKG, no obvious stemi on EKG today She is asymptomatic currently She is ambulatory w/ steady gait, feels back to baseline On ambient air Tolerating PO w/o difficulty.  Reports she will try to take her medications regularly and will f/u with her PCP    The patient's chest pain is not suggestive of pulmonary embolus, cardiac ischemia, aortic dissection, pericarditis, myocarditis, pulmonary embolism, pneumothorax, pneumonia, Zoster, or esophageal perforation, or other serious etiology.  Historically not abrupt in onset, tearing or ripping, pulses symmetric. EKG nonspecific for ischemia/infarction. No dysrhythmias, brugada, WPW, prolonged QT noted.   Troponin negative x2. CXR reviewed. Labs without demonstration of acute pathology unless otherwise noted above. Low HEART Score: 0-3 points (0.9-1.7% risk of MACE).  Given the extremely low risk of these diagnoses further testing and evaluation for these possibilities does not appear to be indicated at this time. Patient in no distress and overall condition improved here in the ED. Detailed discussions were had with the patient regarding current findings, and need for close f/u with PCP or on call doctor. The patient has been instructed to return immediately if the symptoms worsen in any way for re-evaluation. Patient verbalized understanding and is in agreement with current  care plan. All questions answered prior to discharge.    Procedures  Final Clinical Impressions(s) / ED Diagnoses     ICD-10-CM   1. Atypical chest pain  R07.89     2. Non-adherence to medical treatment  Z91.199       ED Discharge Orders          Ordered    clobetasol ointment (TEMOVATE) 0.05 %  At bedtime PRN        10/17/22 1225    lisinopril-hydrochlorothiazide (ZESTORETIC) 20-25 MG tablet  Daily        10/17/22 1225              Discharge Instructions      Your CT scan shows findings concerning for possible pulmonary hypertension, please follow up with pulmonologist as noted above  Return to the Emergency Department if you have unusual chest pain, pressure, or discomfort, shortness of breath, nausea, vomiting, burping, heartburn, tingling upper body parts, sweating, cold, clammy skin, or racing heartbeat. Call 911 if you think you are having a heart attack. Take all cardiac medications as prescribed - notify your doctor if you have any side effects. Follow cardiac diet - avoid fatty & fried foods, don't eat too much red meat, eat lots of fruits & vegetables, and dairy products should be low fat. Please lose weight if you are overweight. Become more active with walking, gardening, or any other activity that gets you to moving.   Please return to the emergency department immediately for any new or concerning symptoms, or if you get worse.           Tanda Rockers  A, DO 10/17/22 1617

## 2022-10-17 NOTE — ED Provider Notes (Signed)
[facility] EMERGENCY DEPARTMENT Provider Note   CSN: [id number] Arrival date & time: 10/17/22  0515     History  Chief Complaint  Patient presents with   Chest Pain    Bayla Mcgovern is a 61 y.o. female.  61 year old female with multiple medical problems who presents the ER today secondary to chest pain and palpitations.  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  She states that she lost her husband recently and has been a bit depressed and not taking care of himself and not taking her medications.  On the way to work tonight she started having some chest pain with palpitations.  She states that she did not take her blood pressure medicine recently.  She is on lisinopril/HCTZ.  Has not been her PCP in over a year.  A lot of this seems part of her grief process however she started having some nausea as well and then the pain can travel down to her epigastric area and then radiated towards the left of her stomach.  No urinary changes.  No fevers or productive cough.  No other associated symptoms.  This usually takes clonazepam and has not taken that recently either and wonders if anxiety could be playing a component.  States that she got some Tums that did not seem to help her chest pain or abdominal pain. She does state after it migrated it kind of went away but now she has elevated BP and HR she is worried about.    Chest Pain      Home Medications Prior to Admission medications   Medication Sig Start Date End Date Taking? Authorizing Provider  aspirin 81 MG EC tablet Take 81 mg by mouth daily. 04/05/16   [provider]  Biotin 10 MG CAPS Take 10 mg by mouth daily.     [provider]  clobetasol ointment (TEMOVATE) 0.05 % Apply 1 application topically at bedtime as needed for rash. 06/29/20   [provider]  famotidine (PEPCID) 20 MG tablet Take 20 mg by mouth 2 (two) times daily as needed for indigestion.  12/07/16    [provider]  Multiple Vitamin (MULTI-VITAMIN) tablet Take 1 tablet by mouth daily.    [provider]  SAXENDA 18 MG/3ML SOPN Inject 2.4 mg into the skin daily.  09/02/20   [provider]  simethicone (MYLICON) 80 MG chewable tablet Chew 1 tablet (80 mg total) by mouth 4 (four) times daily as needed for flatulence. 10/22/20   Rolly Salter, MD      Allergies    Patient has no known allergies.    Review of Systems   Review of Systems  Cardiovascular:  Positive for chest pain.    Physical Exam Updated Vital Signs BP (!) 145/89   Pulse (!) 120   Temp 99.1 F (37.3 C) (Oral)   Resp (!) 22   Ht 5\' 3"  (1.6 m)   Wt 101.2 kg   SpO2 98%   BMI 39.50 kg/m  Physical Exam Vitals and nursing note reviewed.  Constitutional:      Appearance: She is well-developed.  HENT:     Head: Normocephalic and atraumatic.  Cardiovascular:     Rate and Rhythm: Regular rhythm. Tachycardia present.  Pulmonary:     Effort: No respiratory distress.     Breath sounds: No stridor.  Abdominal:     General: There is no distension.  Musculoskeletal:     Cervical  back: Normal range of motion.     Right lower leg: No edema.     Left lower leg: No edema.  Neurological:     Mental Status: She is alert.     ED Results / Procedures / Treatments   Labs (all labs ordered are listed, but only abnormal results are displayed) Labs Reviewed  CBC WITH DIFFERENTIAL/PLATELET - Abnormal; Notable for the following components:      Result Value   WBC 14.9 (*)    Neutro Abs 11.7 (*)    Monocytes Absolute 1.3 (*)    Abs Immature Granulocytes 0.09 (*)    All other components within normal limits  D-DIMER, QUANTITATIVE - Abnormal; Notable for the following components:   D-Dimer, Quant 1.60 (*)    All other components within normal limits  COMPREHENSIVE METABOLIC PANEL  LIPASE, BLOOD  TROPONIN I (HIGH SENSITIVITY)    EKG None  Radiology DG Chest Portable 1 View  Result  Date: 10/17/2022 CLINICAL DATA:  61 year old female with chest pain and palpitations. EXAM: PORTABLE CHEST 1 VIEW COMPARISON:  Portable chest 10/21/2020 and earlier. FINDINGS: Portable AP upright view at 0609 hours. Mildly lordotic positioning. Lung volumes and mediastinal contours remain normal. Visualized tracheal air column is within normal limits. Allowing for portable technique the lungs are clear. No pneumothorax or pleural effusion. Paucity of bowel gas. No acute osseous abnormality identified. IMPRESSION: Negative portable chest. Electronically Signed   By: Genevie Ann M.D.   On: 10/17/2022 06:55    Procedures Procedures    Medications Ordered in ED Medications  alum & mag hydroxide-simeth (MAALOX/MYLANTA) 200-200-20 MG/5ML suspension 30 mL (has no administration in time range)  dicyclomine (BENTYL) 10 MG/5ML solution 10 mg (has no administration in time range)  clonazePAM (KLONOPIN) tablet 0.5 mg (has no administration in time range)  pantoprazole (PROTONIX) EC tablet 40 mg (has no administration in time range)  lisinopril (ZESTRIL) tablet 10 mg (has no administration in time range)  hydrochlorothiazide (HYDRODIURIL) tablet 25 mg (has no administration in time range)    ED Course/ Medical Decision Making/ A&P                           Medical Decision Making Amount and/or Complexity of Data Reviewed Labs: ordered. Radiology: ordered.  Risk OTC drugs. Prescription drug management.   Very well could've been anxiety vs PUD. However with tachypnea and tachycardia will check d dimer/troponin. Will still need acs rule out and eval for others.  Cxr looks good.  D dimer elevated. Ecg with tachycardia. Will get PE scan, will scan with abdomen as well.  Care transferred pending imaging.   Final Clinical Impression(s) / ED Diagnoses Final diagnoses:  None    Rx / DC Orders ED Discharge Orders     None         Miangel Flom, Corene Cornea, MD 10/17/22 [phone]

## 2022-11-07 DIAGNOSIS — L309 Dermatitis, unspecified: Secondary | ICD-10-CM | POA: Insufficient documentation

## 2023-09-14 ENCOUNTER — Emergency Department (HOSPITAL_COMMUNITY): Payer: BLUE CROSS/BLUE SHIELD

## 2023-09-14 ENCOUNTER — Observation Stay (HOSPITAL_COMMUNITY)
Admission: EM | Admit: 2023-09-14 | Discharge: 2023-09-16 | Disposition: A | Discharge status: Home / Self Care | Payer: BLUE CROSS/BLUE SHIELD | Attending: Internal Medicine | Admitting: Internal Medicine

## 2023-09-14 DIAGNOSIS — Z79899 Other long term (current) drug therapy: Secondary | ICD-10-CM | POA: Diagnosis not present

## 2023-09-14 DIAGNOSIS — Z7982 Long term (current) use of aspirin: Secondary | ICD-10-CM | POA: Insufficient documentation

## 2023-09-14 DIAGNOSIS — Z6841 Body Mass Index (BMI) 40.0 and over, adult: Secondary | ICD-10-CM | POA: Diagnosis not present

## 2023-09-14 DIAGNOSIS — R0602 Shortness of breath: Secondary | ICD-10-CM | POA: Diagnosis present

## 2023-09-14 DIAGNOSIS — E669 Obesity, unspecified: Secondary | ICD-10-CM | POA: Diagnosis not present

## 2023-09-14 DIAGNOSIS — I10 Essential (primary) hypertension: Secondary | ICD-10-CM | POA: Diagnosis not present

## 2023-09-14 DIAGNOSIS — J209 Acute bronchitis, unspecified: Secondary | ICD-10-CM | POA: Diagnosis not present

## 2023-09-14 DIAGNOSIS — R000 Tachycardia, unspecified: Secondary | ICD-10-CM

## 2023-09-14 DIAGNOSIS — F17210 Nicotine dependence, cigarettes, uncomplicated: Secondary | ICD-10-CM | POA: Insufficient documentation

## 2023-09-14 LAB — CBC WITH DIFFERENTIAL/PLATELET
Abs Immature Granulocytes: 0.05 10*3/uL (ref 0.00–0.07)
Basophils Absolute: 0.1 10*3/uL (ref 0.0–0.1)
Basophils Relative: 1 %
Eosinophils Absolute: 0.1 10*3/uL (ref 0.0–0.5)
Eosinophils Relative: 2 %
HCT: 36.4 % (ref 36.0–46.0)
Hemoglobin: 12.3 g/dL (ref 12.0–15.0)
Immature Granulocytes: 1 %
Lymphocytes Relative: 39 %
Lymphs Abs: 3.3 10*3/uL (ref 0.7–4.0)
MCH: 33.2 pg (ref 26.0–34.0)
MCHC: 33.8 g/dL (ref 30.0–36.0)
MCV: 98.1 fL (ref 80.0–100.0)
Monocytes Absolute: 0.7 10*3/uL (ref 0.1–1.0)
Monocytes Relative: 8 %
Neutro Abs: 4.2 10*3/uL (ref 1.7–7.7)
Neutrophils Relative %: 49 %
Platelets: 221 10*3/uL (ref 150–400)
RBC: 3.71 MIL/uL — ABNORMAL LOW (ref 3.87–5.11)
RDW: 12.4 % (ref 11.5–15.5)
WBC: 8.5 10*3/uL (ref 4.0–10.5)
nRBC: 0 % (ref 0.0–0.2)

## 2023-09-14 LAB — TROPONIN I (HIGH SENSITIVITY)
Troponin I (High Sensitivity): 8 ng/L (ref <18)
Troponin I (High Sensitivity): 8 ng/L (ref <18)

## 2023-09-14 LAB — COMPREHENSIVE METABOLIC PANEL
ALT: 54 U/L — ABNORMAL HIGH (ref 0–44)
AST: 111 U/L — ABNORMAL HIGH (ref 15–41)
Albumin: 3.1 g/dL — ABNORMAL LOW (ref 3.5–5.0)
Alkaline Phosphatase: 94 U/L (ref 38–126)
Anion gap: 18 — ABNORMAL HIGH (ref 5–15)
BUN: 8 mg/dL (ref 8–23)
CO2: 25 mmol/L (ref 22–32)
Calcium: 8.5 mg/dL — ABNORMAL LOW (ref 8.9–10.3)
Chloride: 96 mmol/L — ABNORMAL LOW (ref 98–111)
Creatinine, Ser: 1.01 mg/dL — ABNORMAL HIGH (ref 0.44–1.00)
GFR, Estimated: >60 mL/min (ref >60)
Glucose, Bld: 168 mg/dL — ABNORMAL HIGH (ref 70–99)
Potassium: 3 mmol/L — ABNORMAL LOW (ref 3.5–5.1)
Sodium: 139 mmol/L (ref 135–145)
Total Bilirubin: 0.5 mg/dL (ref 0.3–1.2)
Total Protein: 6.5 g/dL (ref 6.5–8.1)

## 2023-09-14 LAB — D-DIMER, QUANTITATIVE: D-Dimer, Quant: 0.74 ug/mL-FEU — ABNORMAL HIGH (ref 0.00–0.50)

## 2023-09-14 LAB — BRAIN NATRIURETIC PEPTIDE: B Natriuretic Peptide: 26.2 pg/mL (ref 0.0–100.0)

## 2023-09-14 MED ORDER — ACETAMINOPHEN 325 MG PO TABS
650.0000 mg | ORAL_TABLET | Freq: Once | ORAL | Status: AC
  Administered 2023-09-15: 650 mg via ORAL
  Filled 2023-09-14: qty 2

## 2023-09-14 MED ORDER — IPRATROPIUM-ALBUTEROL 0.5-2.5 (3) MG/3ML IN SOLN
3.0000 mL | Freq: Once | RESPIRATORY_TRACT | Status: DC
  Filled 2023-09-14: qty 3

## 2023-09-14 MED ORDER — IOHEXOL 350 MG/ML SOLN
75.0000 mL | Freq: Once | INTRAVENOUS | Status: AC | PRN
  Administered 2023-09-14: 90 mL via INTRAVENOUS

## 2023-09-14 MED ORDER — POTASSIUM CHLORIDE CRYS ER 20 MEQ PO TBCR
40.0000 meq | EXTENDED_RELEASE_TABLET | Freq: Once | ORAL | Status: AC
  Administered 2023-09-15: 40 meq via ORAL
  Filled 2023-09-14: qty 2

## 2023-09-14 NOTE — ED Triage Notes (Signed)
Ongoing shortness of breath, wheezing since pt had covid a month ago. 97% on RA but sob worsened on exertion.  2 Duoneb and 125mg  solumedrol given en route by EMS.  EMS VS: CBG 122 180/100 24 RR 100 HR 98% on RA

## 2023-09-14 NOTE — ED Notes (Signed)
Patient transported to CT 

## 2023-09-14 NOTE — ED Provider Notes (Signed)
[facility] EMERGENCY DEPARTMENT AT [facility] Provider Note   CSN: [id number] Arrival date & time: 09/14/23  1824     History  Chief Complaint  Patient presents with   Shortness of Breath    Elaine Martinez is a 62 y.o. female.   Shortness of Breath 62 year old female with past medical history of GERD, hypertension presenting for evaluation of shortness of breath.  Patient states that for the last 3 to 4 days she has had cough productive of large amount of sputum.  This is atypical for her.  She states that yesterday she developed some wheezing symptoms and called EMS.  When EMS arrived they gave her breathing treatments with improvement in symptoms.  Patient did not think she needed to go to the hospital.  She was doing well this morning, but then this afternoon she again developed respiratory distress.  She felt that she was wheezing more significantly.  EMS was again called and she was treated with 2 DuoNeb treatments as well as 125 mg of Solu-Medrol.  Patient states that this did help her breathing.  Patient denies any history of asthma or COPD.  No inhaler use at home.  However, patient has a 50-year smoking history where she has smoked 1 pack/day over this time.  Denies any fevers, chest pain, abdominal pain, pain with urination, urinary frequency.  Patient denies any cancer history in her cell.  No recent travel, surgery, prolonged immobilization.  No known history of clotting disorder.     Home Medications Prior to Admission medications   Medication Sig Start Date End Date Taking? Authorizing Provider  aspirin 81 MG EC tablet Take 81 mg by mouth daily. 04/05/16   [provider]  Biotin 10 MG CAPS Take 10 mg by mouth daily.     [provider]  calcium carbonate (TUMS - DOSED IN MG ELEMENTAL CALCIUM) 500 MG chewable tablet Chew 1,000 mg by mouth 2 (two) times daily as needed for indigestion or heartburn.    [provider]  clobetasol  ointment (TEMOVATE) 0.05 % Apply 1 Application topically at bedtime as needed. 10/17/22   Sloan Leiter, DO  famotidine (PEPCID) 20 MG tablet Take 20 mg by mouth 2 (two) times daily as needed for indigestion.  12/07/16   [provider]  lisinopril-hydrochlorothiazide (ZESTORETIC) 20-25 MG tablet Take 1 tablet by mouth daily. 10/17/22   Sloan Leiter, DO  Multiple Vitamin (MULTI-VITAMIN) tablet Take 1 tablet by mouth daily.    [provider]  simethicone (MYLICON) 80 MG chewable tablet Chew 1 tablet (80 mg total) by mouth 4 (four) times daily as needed for flatulence. 10/22/20   Rolly Salter, MD      Allergies    Patient has no known allergies.    Review of Systems   Review of Systems  Respiratory:  Positive for shortness of breath.     Physical Exam Updated Vital Signs BP (!) 149/77   Pulse (!) 128   Temp 98.5 F (36.9 C) (Oral)   Resp (!) 24   Ht 5\' 3"  (1.6 m)   Wt 105.7 kg   SpO2 100%   BMI 41.27 kg/m  Physical Exam Constitutional:      General: She is not in acute distress.    Appearance: She is not ill-appearing.  HENT:     Head: Normocephalic.     Mouth/Throat:     Mouth: Mucous membranes are moist.  Cardiovascular:     Rate and  Rhythm: Tachycardia present.  Pulmonary:     Effort: Pulmonary effort is normal.     Breath sounds: Wheezing present. No decreased breath sounds or rhonchi.  Chest:     Chest wall: No mass or tenderness.  Abdominal:     Palpations: Abdomen is soft.     Tenderness: There is no guarding or rebound.  Musculoskeletal:     Cervical back: Normal range of motion.     Right lower leg: No tenderness. Edema present.     Left lower leg: No tenderness. Edema present.  Skin:    General: Skin is warm and dry.     Findings: No rash.  Neurological:     General: No focal deficit present.     Mental Status: She is alert.     Cranial Nerves: No cranial nerve deficit.     Motor: No weakness.     ED Results / Procedures /  Treatments   Labs (all labs ordered are listed, but only abnormal results are displayed) Labs Reviewed  CBC WITH DIFFERENTIAL/PLATELET - Abnormal; Notable for the following components:      Result Value   RBC 3.71 (*)    All other components within normal limits  COMPREHENSIVE METABOLIC PANEL - Abnormal; Notable for the following components:   Potassium 3.0 (*)    Chloride 96 (*)    Glucose, Bld 168 (*)    Creatinine, Ser 1.01 (*)    Calcium 8.5 (*)    Albumin 3.1 (*)    AST 111 (*)    ALT 54 (*)    Anion gap 18 (*)    All other components within normal limits  D-DIMER, QUANTITATIVE - Abnormal; Notable for the following components:   D-Dimer, Quant 0.74 (*)    All other components within normal limits  BRAIN NATRIURETIC PEPTIDE  TSH  T4, FREE  MAGNESIUM  TROPONIN I (HIGH SENSITIVITY)  TROPONIN I (HIGH SENSITIVITY)    EKG None  Radiology CT Angio Chest PE W and/or Wo Contrast  Result Date: 09/14/2023 CLINICAL DATA:  Shortness of breath. EXAM: CT ANGIOGRAPHY CHEST WITH CONTRAST TECHNIQUE: Multidetector CT imaging of the chest was performed using the standard protocol during bolus administration of intravenous contrast. Multiplanar CT image reconstructions and MIPs were obtained to evaluate the vascular anatomy. RADIATION DOSE REDUCTION: This exam was performed according to the departmental dose-optimization program which includes automated exposure control, adjustment of the mA and/or kV according to patient size and/or use of iterative reconstruction technique. CONTRAST:  90mL OMNIPAQUE IOHEXOL 350 MG/ML SOLN COMPARISON:  October 17, 2022 FINDINGS: Cardiovascular: There is mild calcification of the aortic arch, without evidence of aortic aneurysm. Satisfactory opacification of the pulmonary arteries to the segmental level. No evidence of pulmonary embolism. Normal heart size. No pericardial effusion. Mediastinum/Nodes: No enlarged mediastinal, hilar, or axillary lymph nodes.  Thyroid gland, trachea, and esophagus demonstrate no significant findings. Lungs/Pleura: Lungs are clear. No pleural effusion or pneumothorax. Upper Abdomen: A stable 1.6 cm x 1.3 cm low-attenuation (approximately 15.95 Hounsfield units) left adrenal mass is seen. Noninflamed diverticula are seen throughout the splenic flexure. Musculoskeletal: Multilevel degenerative changes are seen throughout the thoracic spine. Review of the MIP images confirms the above findings. IMPRESSION: 1. No evidence of pulmonary embolism or acute cardiopulmonary disease. 2. Stable left adrenal adenoma. 3. Colonic diverticulosis. 4. Aortic atherosclerosis. Aortic Atherosclerosis (ICD10-I70.0). Electronically Signed   By: Aram Candela M.D.   On: 09/14/2023 23:42   DG Chest Portable 1 View  Result Date:  09/14/2023 CLINICAL DATA:  Shortness of breath and wheezing. EXAM: PORTABLE CHEST 1 VIEW COMPARISON:  10/17/2022. FINDINGS: The heart is mildly enlarged and the mediastinal contour is within normal limits. No consolidation, effusion, or pneumothorax. No acute osseous abnormality. IMPRESSION: No active disease. Electronically Signed   By: Thornell Sartorius M.D.   On: 09/14/2023 21:36    Procedures Procedures    Medications Ordered in ED Medications  ipratropium-albuterol (DUONEB) 0.5-2.5 (3) MG/3ML nebulizer solution 3 mL (has no administration in time range)  acetaminophen (TYLENOL) tablet 650 mg (has no administration in time range)  potassium chloride SA (KLOR-CON M) CR tablet 40 mEq (has no administration in time range)  iohexol (OMNIPAQUE) 350 MG/ML injection 75 mL (90 mLs Intravenous Contrast Given 09/14/23 2212)    ED Course/ Medical Decision Making/ A&P      Wells' Criteria for Pulmonary Embolism from [email or web address]  on 09/14/2023 ** All calculations should be rechecked by clinician prior to use **  RESULT SUMMARY: 4.5 points Moderate risk group: 16.2% chance of PE in an ED population.   Another study assigned  scores <= 4 as "PE Unlikely" and had a 3% incidence of PE.  Another study assigned scores > 4 as "PE Likely" and had a 28% incidence of PE.   INPUTS: Clinical signs and symptoms of DVT --> 3 = Yes PE is #1 diagnosis OR equally likely --> 0 = No Heart rate > 100 --> 1.5 = Yes Immobilization at least 3 days OR surgery in the previous 4 weeks --> 0 = No Previous, objectively diagnosed PE or DVT --> 0 = No Hemoptysis --> 0 = No Malignancy w/ treatment within 6 months or palliative --> 0 = No                              Medical Decision Making Amount and/or Complexity of Data Reviewed Labs: ordered. Radiology: ordered.  Risk OTC drugs. Prescription drug management.   Elaine Martinez is a 62 y.o. female with PMH of GERD, HTN, tobacco use, who presents to the ED with shortness of breath and productive cough. Exam notable for mild expiratory wheezing. Afebrile, hemodynamically stable, SaO2 100% on room air.  Differential diagnosis includes: pneumonia, COPD exacerbation, asthma exacerbation, CHF exacerbation, ACS, pulmonary embolism, pneumothorax. Unlikely PNA as CXR without focal consolidation, no leukocytosis, no fever.  Unlikely CHF exacerbation as no history of CHF, BNP within normal limits. Unlikely ACS as troponin 8 (repeat also 8), EKG without acute ischemia.  Patient was in the moderate risk PE group per Wells, therefore D-dimer was obtained.  This was abnormal at 0.74, therefore CTA PE study was completed, which was negative for PE. Doubt Aortic Dissection, Pancreatitis, Pneumothorax, Arrhythmia, Endo/Myo/Pericarditis, Esophageal pathology, or other Emergent pathology.  COPD exacerbation is certainly feasible given patient with extensive tobacco use history with productive cough and shortness of breath.  On reassessment, patient remained tachycardic and exhibited additional wheezing for which repeat duonebs were ordered. Lab workup reviewed which showed low potassium at 3.0 for which  replacement was provided. Increase in AST/ALT at 111 and 54 noted. No leukocytosis.   At time of signout, patient had not yet received repeat round of DuoNebs.  Oncoming ED team is aware of plan to reassess breathing status and tachycardia following this intervention. Patient likely also needs RLE DVT ultrasound given mild asymmetry with edema, but this study may not be able to be completed overnight. Ultimate disposition pending.  The plan for this patient was discussed with Dr. Dalene Seltzer, who voiced agreement and who oversaw evaluation and treatment of this patient.    Final Clinical Impression(s) / ED Diagnoses Final diagnoses:  Shortness of breath    Rx / DC Orders ED Discharge Orders     None         Lyman Speller, MD 09/15/23 1610    Alvira Monday, MD 09/15/23 1317

## 2023-09-15 ENCOUNTER — Other Ambulatory Visit: Payer: Self-pay

## 2023-09-15 ENCOUNTER — Encounter (HOSPITAL_COMMUNITY): Payer: Self-pay | Admitting: Internal Medicine

## 2023-09-15 DIAGNOSIS — I10 Essential (primary) hypertension: Secondary | ICD-10-CM | POA: Diagnosis not present

## 2023-09-15 DIAGNOSIS — J209 Acute bronchitis, unspecified: Secondary | ICD-10-CM | POA: Diagnosis not present

## 2023-09-15 DIAGNOSIS — E669 Obesity, unspecified: Secondary | ICD-10-CM | POA: Diagnosis present

## 2023-09-15 LAB — BASIC METABOLIC PANEL
Anion gap: 15 (ref 5–15)
BUN: 10 mg/dL (ref 8–23)
CO2: 23 mmol/L (ref 22–32)
Calcium: 8.6 mg/dL — ABNORMAL LOW (ref 8.9–10.3)
Chloride: 97 mmol/L — ABNORMAL LOW (ref 98–111)
Creatinine, Ser: 1.33 mg/dL — ABNORMAL HIGH (ref 0.44–1.00)
GFR, Estimated: 45 mL/min — ABNORMAL LOW (ref >60)
Glucose, Bld: 152 mg/dL — ABNORMAL HIGH (ref 70–99)
Potassium: 4.3 mmol/L (ref 3.5–5.1)
Sodium: 135 mmol/L (ref 135–145)

## 2023-09-15 LAB — CBC
HCT: 37.8 % (ref 36.0–46.0)
Hemoglobin: 12.9 g/dL (ref 12.0–15.0)
MCH: 33.9 pg (ref 26.0–34.0)
MCHC: 34.1 g/dL (ref 30.0–36.0)
MCV: 99.5 fL (ref 80.0–100.0)
Platelets: 231 10*3/uL (ref 150–400)
RBC: 3.8 MIL/uL — ABNORMAL LOW (ref 3.87–5.11)
RDW: 12.4 % (ref 11.5–15.5)
WBC: 8.1 10*3/uL (ref 4.0–10.5)
nRBC: 0.2 % (ref 0.0–0.2)

## 2023-09-15 LAB — RESPIRATORY PANEL BY PCR

## 2023-09-15 LAB — URINALYSIS, ROUTINE W REFLEX MICROSCOPIC
Bacteria, UA: NONE SEEN
Bilirubin Urine: NEGATIVE
Glucose, UA: NEGATIVE mg/dL
Ketones, ur: NEGATIVE mg/dL
Leukocytes,Ua: NEGATIVE
Nitrite: NEGATIVE
Protein, ur: NEGATIVE mg/dL
Specific Gravity, Urine: 1.025 (ref 1.005–1.030)
pH: 7 (ref 5.0–8.0)

## 2023-09-15 LAB — TSH: TSH: 0.744 u[IU]/mL (ref 0.350–4.500)

## 2023-09-15 LAB — T4, FREE: Free T4: 0.79 ng/dL (ref 0.61–1.12)

## 2023-09-15 LAB — MAGNESIUM: Magnesium: 0.9 mg/dL — CL (ref 1.7–2.4)

## 2023-09-15 LAB — HIV ANTIBODY (ROUTINE TESTING W REFLEX): HIV Screen 4th Generation wRfx: NONREACTIVE

## 2023-09-15 MED ORDER — PREDNISONE 20 MG PO TABS
40.0000 mg | ORAL_TABLET | Freq: Every day | ORAL | Status: DC
  Administered 2023-09-15 – 2023-09-16 (×2): 40 mg via ORAL
  Filled 2023-09-15 (×2): qty 2

## 2023-09-15 MED ORDER — ALBUTEROL SULFATE (2.5 MG/3ML) 0.083% IN NEBU
2.5000 mg | INHALATION_SOLUTION | RESPIRATORY_TRACT | Status: DC | PRN
  Administered 2023-09-16: 2.5 mg via RESPIRATORY_TRACT
  Filled 2023-09-15: qty 3

## 2023-09-15 MED ORDER — HYDROCHLOROTHIAZIDE 25 MG PO TABS
25.0000 mg | ORAL_TABLET | Freq: Every day | ORAL | Status: DC
  Administered 2023-09-15 – 2023-09-16 (×2): 25 mg via ORAL
  Filled 2023-09-15 (×2): qty 1

## 2023-09-15 MED ORDER — UMECLIDINIUM BROMIDE 62.5 MCG/ACT IN AEPB
1.0000 | INHALATION_SPRAY | Freq: Every day | RESPIRATORY_TRACT | Status: DC
  Administered 2023-09-15 – 2023-09-16 (×2): 1 via RESPIRATORY_TRACT
  Filled 2023-09-15: qty 7

## 2023-09-15 MED ORDER — LISINOPRIL-HYDROCHLOROTHIAZIDE 20-25 MG PO TABS: 1.0000 | ORAL_TABLET | Freq: Every day | ORAL | Status: DC

## 2023-09-15 MED ORDER — LISINOPRIL 20 MG PO TABS
20.0000 mg | ORAL_TABLET | Freq: Every day | ORAL | Status: DC
  Administered 2023-09-15 – 2023-09-16 (×2): 20 mg via ORAL
  Filled 2023-09-15 (×2): qty 1

## 2023-09-15 MED ORDER — UMECLIDINIUM BROMIDE 62.5 MCG/ACT IN AEPB
1.0000 | INHALATION_SPRAY | Freq: Every day | RESPIRATORY_TRACT | Status: DC
  Filled 2023-09-15: qty 7

## 2023-09-15 MED ORDER — ACETAMINOPHEN 500 MG PO TABS
1000.0000 mg | ORAL_TABLET | Freq: Four times a day (QID) | ORAL | Status: DC | PRN
  Administered 2023-09-15 – 2023-09-16 (×2): 1000 mg via ORAL
  Filled 2023-09-15 (×2): qty 2

## 2023-09-15 MED ORDER — MAGNESIUM SULFATE 2 GM/50ML IV SOLN
2.0000 g | Freq: Once | INTRAVENOUS | Status: AC
  Administered 2023-09-15: 2 g via INTRAVENOUS
  Filled 2023-09-15: qty 50

## 2023-09-15 MED ORDER — ASPIRIN 81 MG PO TBEC
81.0000 mg | DELAYED_RELEASE_TABLET | Freq: Every day | ORAL | Status: DC
  Administered 2023-09-15 – 2023-09-16 (×2): 81 mg via ORAL
  Filled 2023-09-15 (×2): qty 1

## 2023-09-15 MED ORDER — NICOTINE 7 MG/24HR TD PT24: 7.0000 mg | MEDICATED_PATCH | Freq: Every day | TRANSDERMAL | Status: DC

## 2023-09-15 MED ORDER — ENOXAPARIN SODIUM 40 MG/0.4ML IJ SOSY
40.0000 mg | PREFILLED_SYRINGE | INTRAMUSCULAR | Status: DC
  Administered 2023-09-15: 40 mg via SUBCUTANEOUS
  Filled 2023-09-15: qty 0.4

## 2023-09-15 MED ORDER — NICOTINE 21 MG/24HR TD PT24
21.0000 mg | MEDICATED_PATCH | Freq: Every day | TRANSDERMAL | Status: DC
  Administered 2023-09-15 – 2023-09-16 (×2): 21 mg via TRANSDERMAL
  Filled 2023-09-15 (×2): qty 1

## 2023-09-15 MED ORDER — MOMETASONE FURO-FORMOTEROL FUM 200-5 MCG/ACT IN AERO
2.0000 | INHALATION_SPRAY | Freq: Two times a day (BID) | RESPIRATORY_TRACT | Status: DC
  Administered 2023-09-15 – 2023-09-16 (×3): 2 via RESPIRATORY_TRACT
  Filled 2023-09-15: qty 8.8

## 2023-09-15 MED ORDER — OXYCODONE HCL 5 MG PO TABS
5.0000 mg | ORAL_TABLET | Freq: Four times a day (QID) | ORAL | Status: DC | PRN
  Administered 2023-09-15: 5 mg via ORAL
  Filled 2023-09-15: qty 1

## 2023-09-15 MED ORDER — DM-GUAIFENESIN ER 30-600 MG PO TB12
1.0000 | ORAL_TABLET | Freq: Two times a day (BID) | ORAL | Status: DC
  Administered 2023-09-15 – 2023-09-16 (×3): 1 via ORAL
  Filled 2023-09-15 (×4): qty 1

## 2023-09-15 MED ORDER — ADULT MULTIVITAMIN W/MINERALS CH
1.0000 | ORAL_TABLET | Freq: Every day | ORAL | Status: DC
  Administered 2023-09-15 – 2023-09-16 (×2): 1 via ORAL
  Filled 2023-09-15 (×2): qty 1

## 2023-09-15 NOTE — Progress Notes (Signed)
Patient seen and examined.  62 year old smoker presented with acute wheezy bronchitis, found to have rhinovirus infection.  Significant symptoms on presentation.  Admitted early morning hours by nighttime hospitalist.  Already symptomatically improving.  Remains tachycardic after multiple rounds of albuterol.  Hemodynamically stable.  On room air.  See H&P assessment plan done by nighttime hospitalist early morning hours.  Will monitor today on aggressive bronchodilator therapy, start chest physiotherapy and incentive spirometry.  Continue steroids.  Anticipate home tomorrow.  Will advise outpatient sleep study.  Electrolytes were replaced aggressively, will recheck tomorrow morning.   Total time spent: 25 minutes.  Same-day admit.  No charge visit.

## 2023-09-15 NOTE — Progress Notes (Signed)
Initial Nutrition Assessment  DOCUMENTATION CODES:   Morbid obesity  INTERVENTION:   -MVI with minerals daily -Liberalize diet to regular for wider variety of meal selections  NUTRITION DIAGNOSIS:   Increased nutrient needs related to acute illness as evidenced by estimated needs.  GOAL:   Patient will meet greater than or equal to 90% of their needs  MONITOR:   PO intake, Supplement acceptance  REASON FOR ASSESSMENT:   Consult Assessment of nutrition requirement/status  ASSESSMENT:   Pt with medical history significant of HTN, ongoing smoking. Admitted with acute bronchitis.  Pt admitted with acute bronchitis.   Reviewed I/O's: +50 ml x 24 hours  Spoke with pt at bedside, who was pleasant and in good spirits today. Pt reports feeling better today and just finished walking the hallways with PT ("the only thing wrong with me is my heart rate").   Pt shares that she has a good appetite and has been eating most of her meals. She did not get to finish her breakfast this morning, as her food got cold after PT session and tray was prematurely taken from her room.   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 She generally consumes 2 meals per day, which consist of a meat, starch, and vegetable. Pt admits that she has gained weight since working third shift and also due to her ozempic being discontinued as it was difficult for her to find. Pt expresses desire to go back on ozempic when she returns to her PCP office. RD reviewed importance of general, healthful nutrition for a healthful lifestyle. Also discussed mechanism of action for ozempic and importance of small, frequent meals with adequate protein intake to prevent very quick weight loss and potential for loss of lean body mass. Pt very appreciative of RD visit.   Reviewed wt hx; no wt loss noted. Pt with mild edema, on  exam, which may be masking further weight loss as well as fat and muscle depletions.  No wt loss edema   Medications reviewed and include bronchitis and prednisone.   Labs reviewed.    NUTRITION - FOCUSED PHYSICAL EXAM:  Flowsheet Row Most Recent Value  Orbital Region No depletion  Upper Arm Region No depletion  Thoracic and Lumbar Region No depletion  Buccal Region No depletion  Temple Region No depletion  Clavicle Bone Region No depletion  Clavicle and Acromion Bone Region No depletion  Scapular Bone Region No depletion  Dorsal Hand No depletion  Patellar Region No depletion  Anterior Thigh Region No depletion  Posterior Calf Region No depletion  Edema (RD Assessment) Mild  Hair Reviewed  Eyes Reviewed  Mouth Reviewed  Skin Reviewed  Nails Reviewed       Diet Order:   Diet Order             Diet regular Room service appropriate? Yes; Fluid consistency: Thin  Diet effective now                   EDUCATION NEEDS:   Education needs have been addressed  Skin:  Skin Assessment: Reviewed RN Assessment  Last BM:  09/15/23  Height:   Ht Readings from Last 1 Encounters:  09/14/23 5\' 3"  (1.6 m)    Weight:   Wt Readings from Last 1 Encounters:  09/14/23 105.7 kg    Ideal Body Weight:  52.3  kg  BMI:  Body mass index is 41.27 kg/m.  Estimated Nutritional Needs:   Kcal:  1650-1850  Protein:  90-105 gramd  Fluid:  > 1.6 L    Levada Schilling, RD, LDN, CDCES Registered Dietitian II Certified Diabetes Care and Education Specialist Please refer to [facility] for RD and/or RD on-call/weekend/after hours pager

## 2023-09-15 NOTE — Progress Notes (Signed)
Physical Therapy Evaluation and d/c Patient Details Name: Elaine Martinez MRN: [id number] DOB: October 14, 1961 Today's Date: 09/15/2023  History of Present Illness  62 yo female presents to ED on 9/26 with SOB, wheezing since covid x1 month ago. Workup for acute bronchitis vs early COPD (not previously diagnosed). PMH includes GERD, HTN, tobacco use.  Clinical Impression   Pt presents with [facility] balance, strength, mobility, and activity tolerance. Pt ambulatory for good hallway distance without difficulty, slightly increased time given dyspnea but SpO2 WFL on RA. Pt did get tachycardic up to 145 bpm, RN notified. Pt at baseline level of functioning, anticipate pt will d/c at independent level. PT to sign off.   spo2 97-100% on RA, HR 124-145 bpm (RN notified)         If plan is discharge home, recommend the following:     Can travel by private vehicle        Equipment Recommendations None recommended by PT  Recommendations for Other Services       Functional Status Assessment Patient has not had a recent decline in their functional status     Precautions / Restrictions Precautions Precautions: Fall Restrictions Weight Bearing Restrictions: No      Mobility  Bed Mobility Overal bed mobility: Independent                  Transfers Overall transfer level: Independent                      Ambulation/Gait Ambulation/Gait assistance: Modified independent (Device/Increase time) Gait Distance (Feet): 350 Feet Assistive device: None Gait Pattern/deviations: Step-through pattern, WFL(Within Functional Limits) Gait velocity: wfl     General Gait Details: wfl gait parameters and speed, no evidence of imbalance. spo2 97-100% on RA, HR 124-145 bpm (RN notified)  Stairs            Wheelchair Mobility     Tilt Bed    Modified Rankin (Stroke Patients Only)       Balance Overall balance assessment: Modified Independent                                            Pertinent Vitals/Pain Pain Assessment Pain Assessment: No/denies pain    Home Living Family/patient expects to be discharged to:: Private residence Living Arrangements: Other relatives (sister in Social worker, grandsons) Available Help at Discharge: Family;Available PRN/intermittently Type of Home: House Home Access: Stairs to enter   Entrance Stairs-Number of Steps: 4 Alternate Level Stairs-Number of Steps: 15 Home Layout: Two level;Bed/bath upstairs Home Equipment: None      Prior Function Prior Level of Function : Independent/Modified Independent               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     Extremity/Trunk Assessment   Upper Extremity Assessment Upper Extremity Assessment: Overall WFL for tasks assessed    Lower Extremity Assessment Lower Extremity Assessment: Overall WFL for tasks assessed    Cervical / Trunk Assessment Cervical / Trunk Assessment: Normal  Communication   Communication Communication: No apparent difficulties  Cognition Arousal: Alert Behavior During Therapy: WFL for tasks assessed/performed Overall Cognitive Status: Within Functional Limits for tasks assessed  General Comments      Exercises     Assessment/Plan    PT Assessment Patient does not need any further PT services  PT Problem List         PT Treatment Interventions      PT Goals (Current goals can be found in the Care Plan section)  Acute Rehab PT Goals Patient Stated Goal: home PT Goal Formulation: All assessment and education complete, DC therapy Time For Goal Achievement: 09/15/23 Potential to Achieve Goals: Good    Frequency       Co-evaluation               AM-PAC PT "6 Clicks" Mobility  Outcome Measure Help needed turning from your back to your side while in a flat bed without using bedrails?: None Help needed moving from lying on your back to sitting on the side  of a flat bed without using bedrails?: None Help needed moving to and from a bed to a chair (including a wheelchair)?: None Help needed standing up from a chair using your arms (e.g., wheelchair or bedside chair)?: None Help needed to walk in hospital room?: None Help needed climbing 3-5 steps with a railing? : None 6 Click Score: 24    End of Session   Activity Tolerance: Patient tolerated treatment well Patient left: in bed;with call bell/phone within reach Nurse Communication: Mobility status PT Visit Diagnosis: Other abnormalities of gait and mobility (R26.89)    Time: 1610-9604 PT Time Calculation (min) (ACUTE ONLY): 17 min   Charges:   PT Evaluation $PT Eval Low Complexity: 1 Low   PT General Charges $$ ACUTE PT VISIT: 1 Visit         Elaine Martinez, PT DPT Acute Rehabilitation Services Secure Chat Preferred  Office [phone]   Elaine Martinez 09/15/2023, 10:52 AM

## 2023-09-15 NOTE — Assessment & Plan Note (Signed)
Followed by PCP

## 2023-09-15 NOTE — ED Notes (Signed)
Pt ambulated to restroom with pulse ox on. Pt's HR went to 137 and sats maintained at 98%. She was very lightheaded and SOB. MD made aware.

## 2023-09-15 NOTE — Assessment & Plan Note (Signed)
Got 4gm Mg in ED to replace.

## 2023-09-15 NOTE — TOC CM/SW Note (Signed)
Transition of Care [facility]) - Inpatient Brief Assessment   Patient Details  Name: Elaine Martinez MRN: [id number] Date of Birth: 1961-02-21  Transition of Care [facility]) CM/SW Contact:    Gala Lewandowsky, RN Phone Number: 09/15/2023, 12:02 PM   Clinical Narrative: Patient presented for shortness of breath-initiated on solumedrol. Patient has insurance and PCP. Case Manager will continue to follow for additional transition of care needs.    Transition of Care Asessment: Insurance and Status: Insurance coverage has been reviewed Patient has primary care physician: Yes Prior/Current Home Services: No current home services Social Determinants of Health Reivew: SDOH reviewed no interventions necessary Readmission risk has been reviewed: Yes Transition of care needs: no transition of care needs at this time

## 2023-09-15 NOTE — Progress Notes (Signed)
RN request RT assess pt. Pt states SOB with COVID+ test last month. Pt also states she is an active smoker.   09/15/23 0144  Therapy Vitals  Pulse Rate (!) 130  Resp (!) 22  BP (!) 147/65  Patient Position (if appropriate) Sitting  MEWS Score/Color  MEWS Score 4  MEWS Score Color Red  Respiratory Assessment  Assessment Type Assess only  Respiratory Pattern Dyspnea with exertion;Labored  Chest Assessment Chest expansion symmetrical  Cough Non-productive  Bilateral Breath Sounds Diminished  R Upper  Breath Sounds Diminished  L Upper Breath Sounds Diminished  R Lower Breath Sounds Diminished  L Lower Breath Sounds Diminished  Oxygen Therapy/Pulse Ox  O2 Device Room Air  SpO2 100 %

## 2023-09-15 NOTE — Evaluation (Addendum)
Occupational Therapy Evaluation Patient Details Name: Elaine Martinez MRN: [id number] DOB: 1961-07-08 Today's Date: 09/15/2023   History of Present Illness 62 yo female presents to ED on 9/26 with SOB, wheezing since covid x1 month ago. Workup for acute bronchitis vs early COPD (not previously diagnosed). PMH includes GERD, HTN, tobacco use.   Clinical Impression   Patient admitted for the diagnosis above.  PTA she lives at home with family, works full time, and remains independent with ADL and iADL.  Patient limited by sudden onset of dizziness, followed by N&V.  Student RN in room and RN alerted.  Patient BP remained stable at 150's/80's with HR in 105 to 110 range.  OT will follow in the acute setting to ensure safe disposition home, but no post acute OT is anticipated.  OT saw the patient earlier, and signed off with no needs.  In addition, patient has been walking with the mobility team without incident.        If plan is discharge home, recommend the following: Assist for transportation    Functional Status Assessment  Patient has had a recent decline in their functional status and demonstrates the ability to make significant improvements in function in a reasonable and predictable amount of time.  Equipment Recommendations  None recommended by OT    Recommendations for Other Services       Precautions / Restrictions Precautions Precautions: Fall Restrictions Weight Bearing Restrictions: No      Mobility Bed Mobility Overal bed mobility: Independent                  Transfers Overall transfer level: Needs assistance   Transfers: Bed to chair/wheelchair/BSC       Step pivot transfers: Supervision            Balance Overall balance assessment: Mild deficits observed, not formally tested                                         ADL either performed or assessed with clinical judgement   ADL       Grooming: Wash/dry hands;Wash/dry  face;Supervision/safety;Standing                   Toilet Transfer: Programmer, multimedia;Ambulation Toilet Transfer Details (indicate cue type and reason): patient with episode of dizziness, nausea dn vomiting.  RN aware.                 Vision Baseline Vision/History: 1 Wears glasses Patient Visual Report: No change from baseline       Perception Perception: Within Functional Limits       Praxis Praxis: WFL       Pertinent Vitals/Pain Pain Assessment Pain Assessment: No/denies pain     Extremity/Trunk Assessment Upper Extremity Assessment Upper Extremity Assessment: Overall WFL for tasks assessed   Lower Extremity Assessment Lower Extremity Assessment: Overall WFL for tasks assessed   Cervical / Trunk Assessment Cervical / Trunk Assessment: Normal   Communication Communication Communication: No apparent difficulties   Cognition Arousal: Alert Behavior During Therapy: WFL for tasks assessed/performed Overall Cognitive Status: Within Functional Limits for tasks assessed                                       General Comments   See above  Exercises     Shoulder Instructions      Home Living Family/patient expects to be discharged to:: Private residence Living Arrangements: Other relatives Available Help at Discharge: Family;Available PRN/intermittently Type of Home: House Home Access: Stairs to enter [employer] of Steps: 4   Home Layout: Two level;Bed/bath upstairs Alternate Level Stairs-Number of Steps: 15   Bathroom Shower/Tub: Chief Strategy Officer: Standard Bathroom Accessibility: Yes How Accessible: Accessible via walker Home Equipment: None          Prior Functioning/Environment Prior Level of Function : Independent/Modified Independent               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        OT Problem List: Impaired balance (sitting and/or  standing)      OT Treatment/Interventions: Self-care/ADL training;Therapeutic activities;Balance training    OT Goals(Current goals can be found in the care plan section) Acute Rehab OT Goals Patient Stated Goal: Return home OT Goal Formulation: With patient Time For Goal Achievement: 09/22/23 Potential to Achieve Goals: Good ADL Goals Pt Will Perform Grooming: Independently;standing Pt Will Transfer to Toilet: Independently;regular height toilet;ambulating  OT Frequency: Min 1X/week    Co-evaluation              AM-PAC OT "6 Clicks" Daily Activity     Outcome Measure Help from another person eating meals?: None Help from another person taking care of personal grooming?: A Little Help from another person toileting, which includes using toliet, bedpan, or urinal?: A Little Help from another person bathing (including washing, rinsing, drying)?: A Little Help from another person to put on and taking off regular upper body clothing?: None Help from another person to put on and taking off regular lower body clothing?: A Little 6 Click Score: 20   End of Session Nurse Communication: Mobility status  Activity Tolerance: Other (comment) (Limited due to dizziness) Patient left: in bed;with call bell/phone within reach  OT Visit Diagnosis: Unsteadiness on feet (R26.81)                Time: 1610-9604 OT Time Calculation (min): 18 min Charges:  OT General Charges $OT Visit: 1 Visit OT Evaluation $OT Eval Moderate Complexity: 1 Mod  09/15/2023  RP, OTR/L  Acute Rehabilitation Services  Office:  [phone]   Suzanna Obey 09/15/2023, 5:38 PM

## 2023-09-15 NOTE — Progress Notes (Signed)
   09/15/23 0510  Assess: MEWS Score  Temp 98.4 F (36.9 C)  BP (!) 160/83  MAP (mmHg) 104  Pulse Rate (!) 124  ECG Heart Rate (!) 134  Resp 20  Level of Consciousness Alert  SpO2 97 %  O2 Device Room Air  Assess: MEWS Score  MEWS Temp 0  MEWS Systolic 0  MEWS Pulse 3  MEWS RR 0  MEWS LOC 0  MEWS Score 3  MEWS Score Color Yellow  Assess: if the MEWS score is Yellow or Red  Were vital signs accurate and taken at a resting state? Yes  Does the patient meet 2 or more of the SIRS criteria? No  MEWS guidelines implemented  Yes, yellow  Treat  MEWS Interventions Considered administering scheduled or prn medications/treatments as ordered  Take Vital Signs  Increase Vital Sign Frequency  Yellow: Q2hr x1, continue Q4hrs until patient remains green for 12hrs  Escalate  MEWS: Escalate Yellow: Discuss with charge nurse and consider notifying provider and/or RRT  Notify: Charge Nurse/RN  Name of Charge Nurse/RN Best boy, RN  Assess: SIRS CRITERIA  SIRS Temperature  0  SIRS Pulse 1  SIRS Respirations  0  SIRS WBC 0  SIRS Score Sum  1

## 2023-09-15 NOTE — H&P (Addendum)
History and Physical    Patient: Elaine Martinez ZOX:[id number] DOB: 06-12-1961 DOA: 09/14/2023 DOS: the patient was seen and examined on 09/15/2023 PCP: [facility]  Patient coming from: Home  Chief Complaint:  Chief Complaint  Patient presents with   Shortness of Breath   HPI: Elaine Martinez is a 62 y.o. female with medical history significant of HTN, ongoing smoking.  Pt in to ED with c/o SOB, wheezing, cough.  This onset 3-4 days ago.  Persistent.  EMS called yesterday, improved with breathing treatments.  Became worse this afternoon with development of respiratory distress.  EMS again called and pt brought in to hospital.  Got solumedrol with EMS.  Multiple breathing treatments.  Breathing improved in ED.  Doesn't have h/o asthma or COPD previously.  Doesn't use inhaler chronically.   Review of Systems: As mentioned in the history of present illness. All other systems reviewed and are negative. Past Medical History:  Diagnosis Date   Diverticulitis of intestine with abscess    Past Surgical History:  Procedure Laterality Date   HERNIA REPAIR     Social History:  reports that she has been smoking cigarettes. She started smoking about 50 years ago. She has a 50 pack-year smoking history. She has never used smokeless tobacco. She reports current alcohol use. She reports that she does not use drugs.  No Known Allergies  No family history on file.  Prior to Admission medications   Medication Sig Start Date End Date Taking? Authorizing Provider  aspirin 81 MG EC tablet Take 81 mg by mouth daily. 04/05/16   [provider]  Biotin 10 MG CAPS Take 10 mg by mouth daily.     [provider]  calcium carbonate (TUMS - DOSED IN MG ELEMENTAL CALCIUM) 500 MG chewable tablet Chew 1,000 mg by mouth 2 (two) times daily as needed for indigestion or heartburn.    [provider]  clobetasol ointment (TEMOVATE) 0.05 % Apply 1 Application topically at  bedtime as needed. 10/17/22   Sloan Leiter, DO  famotidine (PEPCID) 20 MG tablet Take 20 mg by mouth 2 (two) times daily as needed for indigestion.  12/07/16   [provider]  lisinopril-hydrochlorothiazide (ZESTORETIC) 20-25 MG tablet Take 1 tablet by mouth daily. 10/17/22   Sloan Leiter, DO  Multiple Vitamin (MULTI-VITAMIN) tablet Take 1 tablet by mouth daily.    [provider]  simethicone (MYLICON) 80 MG chewable tablet Chew 1 tablet (80 mg total) by mouth 4 (four) times daily as needed for flatulence. 10/22/20   Rolly Salter, MD    Physical Exam: Vitals:   09/14/23 2251 09/14/23 2341 09/14/23 2345 09/15/23 0144  BP:  (!) 149/73 (!) 149/77 (!) 147/65  Pulse:  (!) 133 (!) 128 (!) 130  Resp:  (!) 24  (!) 22  Temp: 98.1 F (36.7 C) 98.5 F (36.9 C)    TempSrc: Oral Oral    SpO2:  100% 100% 100%  Weight:      Height:       Constitutional: NAD, calm, comfortable Respiratory: Diffuse wheezing Cardiovascular: Tachycardic Abdomen: no tenderness, no masses palpated. No hepatosplenomegaly. Bowel sounds positive.  Neurologic: CN 2-12 grossly intact. Sensation intact, DTR normal. Strength 5/5 in all 4.  Psychiatric: Normal judgment and insight. Alert and oriented x 3. Normal mood.   Data Reviewed:    Labs on Admission: I have personally reviewed following labs and imaging studies  CBC: Recent Labs  Lab 09/14/23 1936  WBC 8.5  NEUTROABS 4.2  HGB 12.3  HCT 36.4  MCV 98.1  PLT 221   Basic Metabolic Panel: Recent Labs  Lab 09/14/23 1936 09/15/23 0109  NA 139  --   K 3.0*  --   CL 96*  --   CO2 25  --   GLUCOSE 168*  --   BUN 8  --   CREATININE 1.01*  --   CALCIUM 8.5*  --   MG  --  0.9*   GFR: Estimated Creatinine Clearance: 66.3 mL/min (A) (by C-G formula based on SCr of 1.01 mg/dL (H)). Liver Function Tests: Recent Labs  Lab 09/14/23 1936  AST 111*  ALT 54*  ALKPHOS 94  BILITOT 0.5  PROT 6.5  ALBUMIN 3.1*   No results for  input(s): "LIPASE", "AMYLASE" in the last 168 hours. No results for input(s): "AMMONIA" in the last 168 hours. Coagulation Profile: No results for input(s): "INR", "PROTIME" in the last 168 hours. Cardiac Enzymes: No results for input(s): "CKTOTAL", "CKMB", "CKMBINDEX", "TROPONINI" in the last 168 hours. BNP (last 3 results) No results for input(s): "PROBNP" in the last 8760 hours. HbA1C: No results for input(s): "HGBA1C" in the last 72 hours. CBG: No results for input(s): "GLUCAP" in the last 168 hours. Lipid Profile: No results for input(s): "CHOL", "HDL", "LDLCALC", "TRIG", "CHOLHDL", "LDLDIRECT" in the last 72 hours. Thyroid Function Tests: Recent Labs    09/15/23 0109  TSH 0.744  FREET4 0.79   Anemia Panel: No results for input(s): "VITAMINB12", "FOLATE", "FERRITIN", "TIBC", "IRON", "RETICCTPCT" in the last 72 hours. Urine analysis:    Component Value Date/Time   COLORURINE AMBER (A) 10/20/2020 1822   APPEARANCEUR CLOUDY (A) 10/20/2020 1822   LABSPEC 1.021 10/20/2020 1822   PHURINE 5.0 10/20/2020 1822   GLUCOSEU NEGATIVE 10/20/2020 1822   HGBUR SMALL (A) 10/20/2020 1822   BILIRUBINUR SMALL (A) 10/20/2020 1822   KETONESUR 5 (A) 10/20/2020 1822   PROTEINUR 30 (A) 10/20/2020 1822   NITRITE NEGATIVE 10/20/2020 1822   LEUKOCYTESUR NEGATIVE 10/20/2020 1822    Radiological Exams on Admission: CT Angio Chest PE W and/or Wo Contrast  Result Date: 09/14/2023 CLINICAL DATA:  Shortness of breath. EXAM: CT ANGIOGRAPHY CHEST WITH CONTRAST TECHNIQUE: Multidetector CT imaging of the chest was performed using the standard protocol during bolus administration of intravenous contrast. Multiplanar CT image reconstructions and MIPs were obtained to evaluate the vascular anatomy. RADIATION DOSE REDUCTION: This exam was performed according to the departmental dose-optimization program which includes automated exposure control, adjustment of the mA and/or kV according to patient size and/or  use of iterative reconstruction technique. CONTRAST:  90mL OMNIPAQUE IOHEXOL 350 MG/ML SOLN COMPARISON:  October 17, 2022 FINDINGS: Cardiovascular: There is mild calcification of the aortic arch, without evidence of aortic aneurysm. Satisfactory opacification of the pulmonary arteries to the segmental level. No evidence of pulmonary embolism. Normal heart size. No pericardial effusion. Mediastinum/Nodes: No enlarged mediastinal, hilar, or axillary lymph nodes. Thyroid gland, trachea, and esophagus demonstrate no significant findings. Lungs/Pleura: Lungs are clear. No pleural effusion or pneumothorax. Upper Abdomen: A stable 1.6 cm x 1.3 cm low-attenuation (approximately 15.95 Hounsfield units) left adrenal mass is seen. Noninflamed diverticula are seen throughout the splenic flexure. Musculoskeletal: Multilevel degenerative changes are seen throughout the thoracic spine. Review of the MIP images confirms the above findings. IMPRESSION: 1. No evidence of pulmonary embolism or acute cardiopulmonary disease. 2. Stable left adrenal adenoma. 3. Colonic diverticulosis. 4. Aortic atherosclerosis. Aortic Atherosclerosis (ICD10-I70.0). Electronically Signed   By:  Aram Candela M.D.   On: 09/14/2023 23:42   DG Chest Portable 1 View  Result Date: 09/14/2023 CLINICAL DATA:  Shortness of breath and wheezing. EXAM: PORTABLE CHEST 1 VIEW COMPARISON:  10/17/2022. FINDINGS: The heart is mildly enlarged and the mediastinal contour is within normal limits. No consolidation, effusion, or pneumothorax. No acute osseous abnormality. IMPRESSION: No active disease. Electronically Signed   By: Thornell Sartorius M.D.   On: 09/14/2023 21:36    EKG: Independently reviewed.   Assessment and Plan: * Acute bronchitis ? If pt starting to have early COPD in setting of chronic ongoing smoking.  PY history sounds to be between 25 and 50 (told me maybe half a pack a day for past 50 years). CTA chest neg for acute findings, contrasting  with the severe wheezing noted on exam. COVID+ last month. COPD pathway RVP pending Solumedrol x1 in ED then prednisone PO daily for 5 days. Scheduled LABA/LAMA and INH steroid PRN SABA. No fever no WBC: Holding off on starting ABx for the moment Maybe get PFTs as outpt after she recovers.  Hypomagnesemia Got 4gm Mg in ED to replace.  Obesity (BMI 30-39.9) Followed by PCP.  HTN (hypertension) Med rec pending      Advance Care Planning:   Code Status: Full Code  Consults: None  Family Communication: No family in room  Severity of Illness: The appropriate patient status for this patient is OBSERVATION. Observation status is judged to be reasonable and necessary in order to provide the required intensity of service to ensure the patient's safety. The patient's presenting symptoms, physical exam findings, and initial radiographic and laboratory data in the context of their medical condition is felt to place them at decreased risk for further clinical deterioration. Furthermore, it is anticipated that the patient will be medically stable for discharge from the hospital within 2 midnights of admission.   Author: Hillary Bow., DO 09/15/2023 2:58 AM  For on call review [email or web address].

## 2023-09-15 NOTE — Assessment & Plan Note (Addendum)
?   If pt starting to have early COPD in setting of chronic ongoing smoking.  PY history sounds to be between 25 and 50 (told me maybe half a pack a day for past 50 years). CTA chest neg for acute findings, contrasting with the severe wheezing noted on exam. COVID+ last month. COPD pathway RVP pending Solumedrol x1 in ED then prednisone PO daily for 5 days. Scheduled LABA/LAMA and INH steroid PRN SABA. No fever no WBC: Holding off on starting ABx for the moment Maybe get PFTs as outpt after she recovers.

## 2023-09-15 NOTE — Progress Notes (Signed)
MEWS Progress Note  Patient Details Name: Elaine Martinez MRN: [id number] DOB: Mar 14, 1961 Today's Date: 09/15/2023   MEWS Flowsheet Documentation:  Assess: MEWS Score Temp: 97.6 F (36.4 C) BP: (!) 160/102 MAP (mmHg): 118 Pulse Rate: (!) 121 ECG Heart Rate: (!) 121 Resp: (!) 28 Level of Consciousness: Alert SpO2: 99 % O2 Device: Room Air Assess: MEWS Score MEWS Temp: 0 MEWS Systolic: 0 MEWS Pulse: 2 MEWS RR: 2 MEWS LOC: 0 MEWS Score: 4 MEWS Score Color: Red Assess: SIRS CRITERIA SIRS Temperature : 0 SIRS Respirations : 1 SIRS Pulse: 1 SIRS WBC: 0 SIRS Score Sum : 2 SIRS Temperature : 0 SIRS Pulse: 1 SIRS Respirations : 0 SIRS WBC: 0 SIRS Score Sum : 1 Assess: if the MEWS score is Yellow or Red Were vital signs accurate and taken at a resting state?: Yes Does the patient meet 2 or more of the SIRS criteria?: Yes Does the patient have a confirmed or suspected source of infection?: No MEWS guidelines implemented : Yes, red Treat MEWS Interventions: Considered administering scheduled or prn medications/treatments as ordered Take Vital Signs Increase Vital Sign Frequency : Red: Q1hr x2, continue Q4hrs until patient remains green for 12hrs Escalate MEWS: Escalate: Red: Discuss with charge nurse and notify provider. Consider notifying RRT. If remains red for 2 hours consider need for higher level of care Notify: Charge Nurse/RN Name of Charge Nurse/RN Notified: Data processing manager Provider Notification Provider Name/Title: Dr. Carolin Coy Date Provider Notified: 09/15/23 Time Provider Notified: 1121 Method of Notification: Page Notification Reason: Change in status Provider response: No new orders Date of Provider Response: 09/15/23 Time of Provider Response: 1125      Minna Antis B 09/15/2023, 11:50 AM

## 2023-09-15 NOTE — Progress Notes (Signed)
Pt stated that she feels nauseated, no emesis at this time; pt also requesting sleep aid medication. Attempted to notify Loney Loh, MD via text page. Awaiting further orders.  Bari Edward, RN

## 2023-09-15 NOTE — Assessment & Plan Note (Signed)
Med rec pending ?

## 2023-09-15 NOTE — ED Provider Notes (Signed)
  Physical Exam  BP (!) 147/65   Pulse (!) 130   Temp 98.5 F (36.9 C) (Oral)   Resp (!) 22   Ht 5\' 3"  (1.6 m)   Wt 105.7 kg   SpO2 100%   BMI 41.27 kg/m   Physical Exam  Procedures  .Critical Care  Performed by: Darrick Grinder, PA-C Authorized by: Darrick Grinder, PA-C   Critical care provider statement:    Critical care time (minutes):  30   Critical care was necessary to treat or prevent imminent or life-threatening deterioration of the following conditions: Magnesium 0.9, persistent tachycardia.   Critical care was time spent personally by me on the following activities:  Development of treatment plan with patient or surrogate, discussions with consultants, evaluation of patient's response to treatment, examination of patient, ordering and review of laboratory studies, ordering and review of radiographic studies, ordering and performing treatments and interventions, pulse oximetry, re-evaluation of patient's condition and review of old charts   Care discussed with: admitting provider     ED Course / MDM    Medical Decision Making Amount and/or Complexity of Data Reviewed Labs: ordered. Radiology: ordered.  Risk OTC drugs. Prescription drug management. Decision regarding hospitalization.   Patient care assumed at shift handoff from Dr. Ledon Snare.  Please see his note for full details.  In short, 62 year old female patient with past medical history significant for GERD and hypertension presented complaining of shortness of breath.  She has had a productive cough for the last 3 to 4 days with a large amount of sputum and developed wheezing yesterday.  EMS administered 2 breathing treatments with improvement of symptoms.  She was doing well during the morning and then developed respiratory distress in the afternoon yesterday.  Wheezing became more significant.  She was treated with 125 mg of Solu-Medrol along with 2 more breathing treatments.  She felt that this was helping  her breathing.  At the emergency department she continued to complain of wheezing and was given further DuoNebs.  She was treated for hypokalemia.  CT angio PE study shows no sign of PE.  Plans to reassess breathing status along with results of TSH, T4, and magnesium levels.  TSH and T4 levels within normal limits.  Magnesium 0.9.  I ordered 2 g of IV magnesium to replenish her magnesium levels.  The patient remains tachycardic and still complains of shortness of breath.  She has a very mild expiratory wheeze in the right lower lobe at this time.  I feel with her persistent tachycardia, shortness of breath, and hypomagnesemia she would benefit from admission for further evaluation and management.  I requested consultation with the hospitalist service. Dr.Gardner agrees to see the patient for admission.        Pamala Duffel 09/15/23 0306    Tilden Fossa, MD 09/15/23 [phone]

## 2023-09-16 DIAGNOSIS — J209 Acute bronchitis, unspecified: Secondary | ICD-10-CM | POA: Diagnosis not present

## 2023-09-16 LAB — BASIC METABOLIC PANEL
Anion gap: 9 (ref 5–15)
BUN: 17 mg/dL (ref 8–23)
CO2: 30 mmol/L (ref 22–32)
Calcium: 9.2 mg/dL (ref 8.9–10.3)
Chloride: 98 mmol/L (ref 98–111)
Creatinine, Ser: 1.1 mg/dL — ABNORMAL HIGH (ref 0.44–1.00)
GFR, Estimated: 56 mL/min — ABNORMAL LOW (ref >60)
Glucose, Bld: 112 mg/dL — ABNORMAL HIGH (ref 70–99)
Potassium: 4.3 mmol/L (ref 3.5–5.1)
Sodium: 137 mmol/L (ref 135–145)

## 2023-09-16 LAB — PHOSPHORUS: Phosphorus: 4.6 mg/dL (ref 2.5–4.6)

## 2023-09-16 LAB — MAGNESIUM: Magnesium: 2.3 mg/dL (ref 1.7–2.4)

## 2023-09-16 MED ORDER — ONDANSETRON HCL 4 MG/2ML IJ SOLN: 4.0000 mg | Freq: Four times a day (QID) | INTRAMUSCULAR | Status: DC | PRN

## 2023-09-16 MED ORDER — PREDNISONE 20 MG PO TABS: 40.0000 mg | ORAL_TABLET | Freq: Every day | ORAL | 0 refills | Status: AC

## 2023-09-16 MED ORDER — NICOTINE 21 MG/24HR TD PT24: 21.0000 mg | MEDICATED_PATCH | Freq: Every day | TRANSDERMAL | 0 refills | Status: DC

## 2023-09-16 MED ORDER — MOMETASONE FURO-FORMOTEROL FUM 200-5 MCG/ACT IN AERO: 2.0000 | INHALATION_SPRAY | Freq: Two times a day (BID) | RESPIRATORY_TRACT | 2 refills | Status: AC

## 2023-09-16 MED ORDER — MELATONIN 3 MG PO TABS
3.0000 mg | ORAL_TABLET | Freq: Every evening | ORAL | Status: DC | PRN
  Administered 2023-09-16: 3 mg via ORAL
  Filled 2023-09-16: qty 1

## 2023-09-16 MED ORDER — ALBUTEROL SULFATE HFA 108 (90 BASE) MCG/ACT IN AERS: 2.0000 | INHALATION_SPRAY | Freq: Four times a day (QID) | RESPIRATORY_TRACT | 2 refills | Status: AC | PRN

## 2023-09-16 NOTE — Discharge Summary (Signed)
Physician Discharge Summary  Elaine Martinez UYE:[id number] DOB: 12-18-1961 DOA: 09/14/2023  PCP: [facility]  Admit date: 09/14/2023 Discharge date: 09/16/2023  Admitted From: Home Disposition: Home  Recommendations for Outpatient Follow-up:  Follow up with PCP in 1-2 weeks   Discharge Condition: Stable CODE STATUS: Full code Diet recommendation: Low-salt diet  Discharge summary: 62 year old smoker presented with acute wheezy bronchitis, found to have rhinovirus infection. Significant symptoms on presentation.  Treated with symptomatic management, bronchodilator therapy and chest physiotherapy.  She received IV steroids followed by prednisone.   Adequately improved today.  Will discharge patient with short course of oral steroids, albuterol as needed.  She can use over-the-counter cough medications.  She has been given Dulera in the hospital, she will take that with her.  Advised smoking cessation and nicotine patches ordered. She is also high risk of sleep apnea, she will benefit with sleep apnea testing as outpatient. Magnesium levels were low, this was replaced.   Discharge Diagnoses:  Principal Problem:   Acute bronchitis Active Problems:   HTN (hypertension)   Obesity (BMI 30-39.9)   Hypomagnesemia    Discharge Instructions  Discharge Instructions     Diet - low sodium heart healthy   Complete by: As directed    Discharge instructions   Complete by: As directed    Can use over the counter cough medications and tylenol   Increase activity slowly   Complete by: As directed       Allergies as of 09/16/2023   No Known Allergies      Medication List     STOP taking these medications    hydrOXYzine 25 MG tablet Commonly known as: ATARAX       TAKE these medications    albuterol 108 (90 Base) MCG/ACT inhaler Commonly known as: VENTOLIN HFA Inhale 2 puffs into the lungs every 6 (six) hours as needed for wheezing or shortness of breath.    aspirin EC 81 MG tablet Take 81 mg by mouth daily.   Biotin 10 MG Caps Take 10 mg by mouth 2 (two) times a week.   clobetasol ointment 0.05 % Commonly known as: TEMOVATE Apply 1 Application topically at bedtime as needed. What changed:  when to take this reasons to take this   IRON PO Take 1 tablet by mouth daily.   lisinopril-hydrochlorothiazide 20-25 MG tablet Commonly known as: ZESTORETIC Take 1 tablet by mouth daily.   mometasone-formoterol 200-5 MCG/ACT Aero Commonly known as: DULERA Inhale 2 puffs into the lungs 2 (two) times daily.   Multi-Vitamin tablet Take 1 tablet by mouth daily.   nicotine 21 mg/24hr patch Commonly known as: NICODERM CQ - dosed in mg/24 hours Place 1 patch (21 mg total) onto the skin daily. Start taking on: September 17, 2023   predniSONE 20 MG tablet Commonly known as: DELTASONE Take 2 tablets (40 mg total) by mouth daily with breakfast for 5 days. Start taking on: September 17, 2023   VITAMIN E PO Take 1 capsule by mouth daily.        No Known Allergies  Consultations: None   Procedures/Studies: CT Angio Chest PE W and/or Wo Contrast  Result Date: 09/14/2023 CLINICAL DATA:  Shortness of breath. EXAM: CT ANGIOGRAPHY CHEST WITH CONTRAST TECHNIQUE: Multidetector CT imaging of the chest was performed using the standard protocol during bolus administration of intravenous contrast. Multiplanar CT image reconstructions and MIPs were obtained to evaluate the vascular anatomy. RADIATION DOSE REDUCTION: This exam was performed according to the  departmental dose-optimization program which includes automated exposure control, adjustment of the mA and/or kV according to patient size and/or use of iterative reconstruction technique. CONTRAST:  90mL OMNIPAQUE IOHEXOL 350 MG/ML SOLN COMPARISON:  October 17, 2022 FINDINGS: Cardiovascular: There is mild calcification of the aortic arch, without evidence of aortic aneurysm. Satisfactory opacification  of the pulmonary arteries to the segmental level. No evidence of pulmonary embolism. Normal heart size. No pericardial effusion. Mediastinum/Nodes: No enlarged mediastinal, hilar, or axillary lymph nodes. Thyroid gland, trachea, and esophagus demonstrate no significant findings. Lungs/Pleura: Lungs are clear. No pleural effusion or pneumothorax. Upper Abdomen: A stable 1.6 cm x 1.3 cm low-attenuation (approximately 15.95 Hounsfield units) left adrenal mass is seen. Noninflamed diverticula are seen throughout the splenic flexure. Musculoskeletal: Multilevel degenerative changes are seen throughout the thoracic spine. Review of the MIP images confirms the above findings. IMPRESSION: 1. No evidence of pulmonary embolism or acute cardiopulmonary disease. 2. Stable left adrenal adenoma. 3. Colonic diverticulosis. 4. Aortic atherosclerosis. Aortic Atherosclerosis (ICD10-I70.0). Electronically Signed   By: Aram Candela M.D.   On: 09/14/2023 23:42   DG Chest Portable 1 View  Result Date: 09/14/2023 CLINICAL DATA:  Shortness of breath and wheezing. EXAM: PORTABLE CHEST 1 VIEW COMPARISON:  10/17/2022. FINDINGS: The heart is mildly enlarged and the mediastinal contour is within normal limits. No consolidation, effusion, or pneumothorax. No acute osseous abnormality. IMPRESSION: No active disease. Electronically Signed   By: Thornell Sartorius M.D.   On: 09/14/2023 21:36   (Echo, Carotid, EGD, Colonoscopy, ERCP)    Subjective: Patient seen in the morning rounds.  Denies any complaints.  Symptoms mostly improved.  Able to walk around without any trouble.  No more breathing issues.  Wants to go home.   Discharge Exam: Vitals:   09/16/23 0834 09/16/23 0841  BP: 137/76   Pulse: 96   Resp: 18   Temp: 97.8 F (36.6 C)   SpO2: 90% 98%   Vitals:   09/16/23 0758 09/16/23 0759 09/16/23 0834 09/16/23 0841  BP:   137/76   Pulse:   96   Resp:   18   Temp:   97.8 F (36.6 C)   TempSrc:   Oral   SpO2: 98% 100% 90%  98%  Weight:      Height:        General: Pt is alert, awake, not in acute distress Cardiovascular: RRR, S1/S2 +, no rubs, no gallops Respiratory: CTA bilaterally, no wheezing, no rhonchi, no added sounds. Abdominal: Soft, NT, ND, bowel sounds +, obese and pendulous. Extremities: no edema, no cyanosis    The results of significant diagnostics from this hospitalization (including imaging, microbiology, ancillary and laboratory) are listed below for reference.     Microbiology: Recent Results (from the past 240 hour(s))  Respiratory (~20 pathogens) panel by PCR     Status: Abnormal   Collection Time: 09/15/23  4:06 AM   Specimen: Nasopharyngeal Swab; Respiratory  Result Value Ref Range Status   Adenovirus NOT DETECTED NOT DETECTED Final   Coronavirus 229E NOT DETECTED NOT DETECTED Final    Comment: (NOTE) The Coronavirus on the Respiratory Panel, DOES NOT test for the novel  Coronavirus (2019 nCoV)    Coronavirus HKU1 NOT DETECTED NOT DETECTED Final   Coronavirus NL63 NOT DETECTED NOT DETECTED Final   Coronavirus OC43 NOT DETECTED NOT DETECTED Final   Metapneumovirus NOT DETECTED NOT DETECTED Final   Rhinovirus / Enterovirus DETECTED (A) NOT DETECTED Final   Influenza A NOT DETECTED NOT DETECTED  Final   Influenza B NOT DETECTED NOT DETECTED Final   Parainfluenza Virus 1 NOT DETECTED NOT DETECTED Final   Parainfluenza Virus 2 NOT DETECTED NOT DETECTED Final   Parainfluenza Virus 3 NOT DETECTED NOT DETECTED Final   Parainfluenza Virus 4 NOT DETECTED NOT DETECTED Final   Respiratory Syncytial Virus NOT DETECTED NOT DETECTED Final   Bordetella pertussis NOT DETECTED NOT DETECTED Final   Bordetella Parapertussis NOT DETECTED NOT DETECTED Final   Chlamydophila pneumoniae NOT DETECTED NOT DETECTED Final   Mycoplasma pneumoniae NOT DETECTED NOT DETECTED Final    Comment: Performed at [facility], [address], [place], [place] [address]     Labs: BNP (last 3  results) Recent Labs    09/14/23 1930  BNP 26.2   Basic Metabolic Panel: Recent Labs  Lab 09/14/23 1936 09/15/23 0109 09/15/23 0406 09/16/23 0410  NA 139  --  135 137  K 3.0*  --  4.3 4.3  CL 96*  --  97* 98  CO2 25  --  23 30  GLUCOSE 168*  --  152* 112*  BUN 8  --  10 17  CREATININE 1.01*  --  1.33* 1.10*  CALCIUM 8.5*  --  8.6* 9.2  MG  --  0.9*  --  2.3  PHOS  --   --   --  4.6   Liver Function Tests: Recent Labs  Lab 09/14/23 1936  AST 111*  ALT 54*  ALKPHOS 94  BILITOT 0.5  PROT 6.5  ALBUMIN 3.1*   No results for input(s): "LIPASE", "AMYLASE" in the last 168 hours. No results for input(s): "AMMONIA" in the last 168 hours. CBC: Recent Labs  Lab 09/14/23 1936 09/15/23 0406  WBC 8.5 8.1  NEUTROABS 4.2  --   HGB 12.3 12.9  HCT 36.4 37.8  MCV 98.1 99.5  PLT 221 231   Cardiac Enzymes: No results for input(s): "CKTOTAL", "CKMB", "CKMBINDEX", "TROPONINI" in the last 168 hours. BNP: Invalid input(s): "POCBNP" CBG: No results for input(s): "GLUCAP" in the last 168 hours. D-Dimer Recent Labs    09/14/23 1930  DDIMER 0.74*   Hgb A1c No results for input(s): "HGBA1C" in the last 72 hours. Lipid Profile No results for input(s): "CHOL", "HDL", "LDLCALC", "TRIG", "CHOLHDL", "LDLDIRECT" in the last 72 hours. Thyroid function studies Recent Labs    09/15/23 0109  TSH 0.744   Anemia work up No results for input(s): "VITAMINB12", "FOLATE", "FERRITIN", "TIBC", "IRON", "RETICCTPCT" in the last 72 hours. Urinalysis    Component Value Date/Time   COLORURINE YELLOW 09/15/2023 1130   APPEARANCEUR CLEAR 09/15/2023 1130   LABSPEC 1.025 09/15/2023 1130   PHURINE 7.0 09/15/2023 1130   GLUCOSEU NEGATIVE 09/15/2023 1130   HGBUR SMALL (A) 09/15/2023 1130   BILIRUBINUR NEGATIVE 09/15/2023 1130   KETONESUR NEGATIVE 09/15/2023 1130   PROTEINUR NEGATIVE 09/15/2023 1130   NITRITE NEGATIVE 09/15/2023 1130   LEUKOCYTESUR NEGATIVE 09/15/2023 1130   Sepsis  Labs Recent Labs  Lab 09/14/23 1936 09/15/23 0406  WBC 8.5 8.1   Microbiology Recent Results (from the past 240 hour(s))  Respiratory (~20 pathogens) panel by PCR     Status: Abnormal   Collection Time: 09/15/23  4:06 AM   Specimen: Nasopharyngeal Swab; Respiratory  Result Value Ref Range Status   Adenovirus NOT DETECTED NOT DETECTED Final   Coronavirus 229E NOT DETECTED NOT DETECTED Final    Comment: (NOTE) The Coronavirus on the Respiratory Panel, DOES NOT test for the novel  Coronavirus (  2019 nCoV)    Coronavirus HKU1 NOT DETECTED NOT DETECTED Final   Coronavirus NL63 NOT DETECTED NOT DETECTED Final   Coronavirus OC43 NOT DETECTED NOT DETECTED Final   Metapneumovirus NOT DETECTED NOT DETECTED Final   Rhinovirus / Enterovirus DETECTED (A) NOT DETECTED Final   Influenza A NOT DETECTED NOT DETECTED Final   Influenza B NOT DETECTED NOT DETECTED Final   Parainfluenza Virus 1 NOT DETECTED NOT DETECTED Final   Parainfluenza Virus 2 NOT DETECTED NOT DETECTED Final   Parainfluenza Virus 3 NOT DETECTED NOT DETECTED Final   Parainfluenza Virus 4 NOT DETECTED NOT DETECTED Final   Respiratory Syncytial Virus NOT DETECTED NOT DETECTED Final   Bordetella pertussis NOT DETECTED NOT DETECTED Final   Bordetella Parapertussis NOT DETECTED NOT DETECTED Final   Chlamydophila pneumoniae NOT DETECTED NOT DETECTED Final   Mycoplasma pneumoniae NOT DETECTED NOT DETECTED Final    Comment: Performed at [facility], [address], [place], [place] [address]     Time coordinating discharge: 29 minutes  SIGNED:   Dorcas Carrow, MD  [facility] 09/16/2023, 2:00 PM

## 2023-12-29 ENCOUNTER — Encounter (HOSPITAL_COMMUNITY): Payer: Self-pay | Admitting: Emergency Medicine

## 2023-12-29 ENCOUNTER — Emergency Department (HOSPITAL_COMMUNITY): Payer: BLUE CROSS/BLUE SHIELD

## 2023-12-29 ENCOUNTER — Other Ambulatory Visit: Payer: Self-pay

## 2023-12-29 ENCOUNTER — Inpatient Hospital Stay (HOSPITAL_COMMUNITY)
Admission: EM | Admit: 2023-12-29 | Discharge: 2024-01-03 | DRG: 603 | Disposition: A | Discharge status: Home Health | Payer: BLUE CROSS/BLUE SHIELD | Attending: Internal Medicine | Admitting: Internal Medicine

## 2023-12-29 DIAGNOSIS — Z7951 Long term (current) use of inhaled steroids: Secondary | ICD-10-CM

## 2023-12-29 DIAGNOSIS — F419 Anxiety disorder, unspecified: Secondary | ICD-10-CM | POA: Diagnosis present

## 2023-12-29 DIAGNOSIS — E872 Acidosis, unspecified: Secondary | ICD-10-CM | POA: Diagnosis present

## 2023-12-29 DIAGNOSIS — L03032 Cellulitis of left toe: Secondary | ICD-10-CM | POA: Diagnosis present

## 2023-12-29 DIAGNOSIS — J4489 Other specified chronic obstructive pulmonary disease: Secondary | ICD-10-CM | POA: Diagnosis present

## 2023-12-29 DIAGNOSIS — E871 Hypo-osmolality and hyponatremia: Secondary | ICD-10-CM | POA: Diagnosis present

## 2023-12-29 DIAGNOSIS — E876 Hypokalemia: Secondary | ICD-10-CM | POA: Diagnosis present

## 2023-12-29 DIAGNOSIS — R000 Tachycardia, unspecified: Secondary | ICD-10-CM | POA: Diagnosis present

## 2023-12-29 DIAGNOSIS — I129 Hypertensive chronic kidney disease with stage 1 through stage 4 chronic kidney disease, or unspecified chronic kidney disease: Secondary | ICD-10-CM | POA: Diagnosis present

## 2023-12-29 DIAGNOSIS — L03116 Cellulitis of left lower limb: Principal | ICD-10-CM | POA: Diagnosis present

## 2023-12-29 DIAGNOSIS — M869 Osteomyelitis, unspecified: Principal | ICD-10-CM

## 2023-12-29 DIAGNOSIS — F1010 Alcohol abuse, uncomplicated: Secondary | ICD-10-CM | POA: Diagnosis present

## 2023-12-29 DIAGNOSIS — M109 Gout, unspecified: Secondary | ICD-10-CM | POA: Diagnosis present

## 2023-12-29 DIAGNOSIS — N1831 Chronic kidney disease, stage 3a: Secondary | ICD-10-CM | POA: Diagnosis present

## 2023-12-29 DIAGNOSIS — F17210 Nicotine dependence, cigarettes, uncomplicated: Secondary | ICD-10-CM | POA: Diagnosis present

## 2023-12-29 DIAGNOSIS — E669 Obesity, unspecified: Secondary | ICD-10-CM | POA: Diagnosis present

## 2023-12-29 DIAGNOSIS — Z6841 Body Mass Index (BMI) 40.0 and over, adult: Secondary | ICD-10-CM

## 2023-12-29 DIAGNOSIS — M86172 Other acute osteomyelitis, left ankle and foot: Secondary | ICD-10-CM | POA: Diagnosis not present

## 2023-12-29 DIAGNOSIS — R7982 Elevated C-reactive protein (CRP): Secondary | ICD-10-CM | POA: Diagnosis present

## 2023-12-29 DIAGNOSIS — Z7982 Long term (current) use of aspirin: Secondary | ICD-10-CM

## 2023-12-29 DIAGNOSIS — Z79899 Other long term (current) drug therapy: Secondary | ICD-10-CM

## 2023-12-29 LAB — C-REACTIVE PROTEIN: CRP: 3.7 mg/dL — ABNORMAL HIGH (ref <1.0)

## 2023-12-29 LAB — COMPREHENSIVE METABOLIC PANEL
ALT: 22 U/L (ref 0–44)
AST: 32 U/L (ref 15–41)
Albumin: 3.2 g/dL — ABNORMAL LOW (ref 3.5–5.0)
Alkaline Phosphatase: 99 U/L (ref 38–126)
Anion gap: 13 (ref 5–15)
BUN: 14 mg/dL (ref 8–23)
CO2: 21 mmol/L — ABNORMAL LOW (ref 22–32)
Calcium: 9.2 mg/dL (ref 8.9–10.3)
Chloride: 100 mmol/L (ref 98–111)
Creatinine, Ser: 1.17 mg/dL — ABNORMAL HIGH (ref 0.44–1.00)
GFR, Estimated: 52 mL/min — ABNORMAL LOW (ref >60)
Glucose, Bld: 109 mg/dL — ABNORMAL HIGH (ref 70–99)
Potassium: 3.4 mmol/L — ABNORMAL LOW (ref 3.5–5.1)
Sodium: 134 mmol/L — ABNORMAL LOW (ref 135–145)
Total Bilirubin: 0.4 mg/dL (ref 0.0–1.2)
Total Protein: 7 g/dL (ref 6.5–8.1)

## 2023-12-29 LAB — CBC WITH DIFFERENTIAL/PLATELET
Abs Immature Granulocytes: 0.1 10*3/uL — ABNORMAL HIGH (ref 0.00–0.07)
Basophils Absolute: 0.1 10*3/uL (ref 0.0–0.1)
Basophils Relative: 1 %
Eosinophils Absolute: 0.1 10*3/uL (ref 0.0–0.5)
Eosinophils Relative: 1 %
HCT: 39.9 % (ref 36.0–46.0)
Hemoglobin: 13.6 g/dL (ref 12.0–15.0)
Immature Granulocytes: 1 %
Lymphocytes Relative: 23 %
Lymphs Abs: 2.3 10*3/uL (ref 0.7–4.0)
MCH: 34.3 pg — ABNORMAL HIGH (ref 26.0–34.0)
MCHC: 34.1 g/dL (ref 30.0–36.0)
MCV: 100.5 fL — ABNORMAL HIGH (ref 80.0–100.0)
Monocytes Absolute: 1.2 10*3/uL — ABNORMAL HIGH (ref 0.1–1.0)
Monocytes Relative: 12 %
Neutro Abs: 6.1 10*3/uL (ref 1.7–7.7)
Neutrophils Relative %: 62 %
Platelets: 262 10*3/uL (ref 150–400)
RBC: 3.97 MIL/uL (ref 3.87–5.11)
RDW: 12 % (ref 11.5–15.5)
WBC: 9.8 10*3/uL (ref 4.0–10.5)
nRBC: 0 % (ref 0.0–0.2)

## 2023-12-29 LAB — SEDIMENTATION RATE: Sed Rate: 33 mm/h — ABNORMAL HIGH (ref 0–22)

## 2023-12-29 LAB — LACTIC ACID, PLASMA: Lactic Acid, Venous: 2.1 mmol/L (ref 0.5–1.9)

## 2023-12-29 MED ORDER — HYDROCODONE-ACETAMINOPHEN 5-325 MG PO TABS
1.0000 | ORAL_TABLET | Freq: Once | ORAL | Status: AC
  Administered 2023-12-29: 1 via ORAL
  Filled 2023-12-29: qty 1

## 2023-12-29 MED ORDER — IOHEXOL 350 MG/ML SOLN
75.0000 mL | Freq: Once | INTRAVENOUS | Status: AC | PRN
  Administered 2023-12-29: 75 mL via INTRAVENOUS

## 2023-12-29 MED ORDER — LACTATED RINGERS IV BOLUS
1000.0000 mL | Freq: Once | INTRAVENOUS | Status: AC
  Administered 2023-12-29: 1000 mL via INTRAVENOUS

## 2023-12-29 NOTE — ED Provider Notes (Signed)
 [place] [facility] AT [facility] Provider Note  HPI   Elaine Martinez is a 63 y.o. female patient with a PMHx of hypertension, anxiety, obesity, alcohol use, who is here today with concern for foot pain.  Patient states that she has had no major acute traumatic injuries she works in a agricultural engineer, however over the past couple days, she has had a gradual worsening of left lower extremity pain.  She states the pain is maximal over the left midfoot.  She did not endorse specifically stepping on anything.  Has had no fevers chills nausea vomiting, just pain    ROS Negative except as per HPI   Medical Decision Making   Upon presentation, the patient is tachycardic to 116 afebrile hemodynamically stable   Patient's left big toe is slightly more swollen compared to the right, she has some tenderness along her mid dorsal aspect of her forefoot, she has intact 2+ DP and PT pulses palpable easily, sensation intact range of motion at the ankle and the toes are intact.  She has pain with palpation of the midfoot  Prior to me even seeing the patient, she has a foot x-ray, which shows concerning findings for possible emphysema, soft tissue swelling.  Concern for occult necrotizing fasciitis, she does have significant pain, she is moaning in pain, will get a CT left foot with IV contrast get a lactic acid C-reactive protein ESR CBC and CMP for screening purposes, I do not think empiric antibiotics is treated as I do have a low suspicion but I cannot rule out clinically that the patient does not have a more occult serious bacterial infection  She is not hypotensive, if she develops a fever or becomes hypotensive or her extremity exam changes I will consider starting antibiotics.  This could just be gout, which I considered as her left big toe was bigger, and also considered plantar fasciitis as well.  She does have this possible retained foreign body in her left big toe which  could be a source of infection or just be causing the pain itself  Vague cortical irregularity along the dorsal aspect of the first  digit distal phalangeal tuft. Finding correlates with  retrospectively similar finding on x-ray left foot 12/29/2023. If  concern for osteomyelitis, consider MRI with and without contrast  for further evaluation.   Will go ahead and order the MRI, she is getting a fluid bolus now, will have repeat lactic acid is the first was 2.1, ESR and CRP are both elevated at 33 and 3.7 no major leukocytosis no major anemia creatinine 1.17  I have transferred care of this patient to the incoming emergency medicine resident at the end of my shift. I have discussed the patient's HPI, physical exam, and workup and therapeutics with the incoming team. Please see their documentation for the remainder of care. Patient is pending MRI   No diagnosis found.  @DISPOSITION @  Rx / DC Orders ED Discharge Orders     None        Past Medical History:  Diagnosis Date   Diverticulitis of intestine with abscess    Past Surgical History:  Procedure Laterality Date   HERNIA REPAIR     History reviewed. No pertinent family history. Social History   Socioeconomic History   Marital status: Married    Spouse name: Not on file   Number of children: Not on file   Years of education: Not on file   Highest education level: Not  on file  Occupational History   Not on file  Tobacco Use   Smoking status: Every Day    Current packs/day: 0.50    Average packs/day: 0.5 packs/day for 50.3 years (25.1 ttl pk-yrs)    Types: Cigarettes    Start date: 09/14/1973   Smokeless tobacco: Never  Vaping Use   Vaping status: Never Used  Substance and Sexual Activity   Alcohol use: Yes    Comment: occasional   Drug use: Never   Sexual activity: Not on file  Other Topics Concern   Not on file  Social History Narrative   Not on file   Social Drivers of Health   Financial Resource  Strain: Not on file  Food Insecurity: No Food Insecurity (09/15/2023)   Hunger Vital Sign    Worried About Running Out of Food in the Last Year: Never true    Ran Out of Food in the Last Year: Never true  Transportation Needs: No Transportation Needs (09/15/2023)   PRAPARE - Administrator, Civil Service (Medical): No    Lack of Transportation (Non-Medical): No  Physical Activity: Not on file  Stress: Not on file  Social Connections: Not on file  Intimate Partner Violence: Not At Risk (09/15/2023)   Humiliation, Afraid, Rape, and Kick questionnaire    Fear of Current or Ex-Partner: No    Emotionally Abused: No    Physically Abused: No    Sexually Abused: No     Physical Exam   Vitals:   12/29/23 1943 12/29/23 1944 12/29/23 2236  BP: 126/78  118/60  Pulse: (!) 116  100  Resp: 18  18  Temp: 98.5 F (36.9 C)    SpO2: 98%  98%  Weight:  105.7 kg   Height:  5' 3 (1.6 m)     Physical Exam Vitals and nursing note reviewed.  Constitutional:      General: She is not in acute distress.    Appearance: She is well-developed.  HENT:     Head: Normocephalic and atraumatic.     Right Ear: External ear normal.     Left Ear: External ear normal.     Nose: Nose normal.     Mouth/Throat:     Mouth: Mucous membranes are moist.  Eyes:     Extraocular Movements: Extraocular movements intact.     Conjunctiva/sclera: Conjunctivae normal.     Pupils: Pupils are equal, round, and reactive to light.  Cardiovascular:     Rate and Rhythm: Normal rate.  Pulmonary:     Effort: Pulmonary effort is normal. No respiratory distress.  Abdominal:     Palpations: Abdomen is soft.     Tenderness: There is no abdominal tenderness.  Musculoskeletal:        General: Swelling present.     Cervical back: Neck supple.     Comments: Patient's left big toe is slightly more swollen compared to the right, she has some tenderness along her mid dorsal aspect of her forefoot, she has intact 2+ DP and  PT pulses palpable easily, sensation intact range of motion at the ankle and the toes are intact.  She has pain with palpation of the midfoot  Skin:    General: Skin is warm and dry.     Capillary Refill: Capillary refill takes less than 2 seconds.  Neurological:     General: No focal deficit present.     Mental Status: She is alert and oriented to person, place, and time.  Procedures   If procedures were preformed on this patient, they are listed below:  Procedures  The patient was seen, evaluated, and treated in conjunction with the attending physician, who voiced agreement in the care provided.  Note generated using Dragon voice dictation software and may contain dictation errors. Please contact me for any clarification or with any questions.   Electronically signed by:  Fairy Kerby Revere, M.D. (PGY-2)    Revere Fairy, MD 12/29/23 CLEMENTINE    Dasie Faden, MD 12/30/23 1534

## 2023-12-29 NOTE — ED Notes (Signed)
 Patient transported to CT

## 2023-12-29 NOTE — ED Triage Notes (Signed)
 Pt BIB EMS from home. C/o L sided foot pain, no obvious deformity/injury, denies any accidents. Started throbbing a few days ago. Pt unable to walk d/t pain.   EMS VS 122/78, 108, R 18, 96% RA, cbg 202

## 2023-12-30 ENCOUNTER — Emergency Department (HOSPITAL_COMMUNITY): Payer: BLUE CROSS/BLUE SHIELD

## 2023-12-30 DIAGNOSIS — F1010 Alcohol abuse, uncomplicated: Secondary | ICD-10-CM | POA: Diagnosis present

## 2023-12-30 DIAGNOSIS — I129 Hypertensive chronic kidney disease with stage 1 through stage 4 chronic kidney disease, or unspecified chronic kidney disease: Secondary | ICD-10-CM | POA: Diagnosis present

## 2023-12-30 DIAGNOSIS — E876 Hypokalemia: Secondary | ICD-10-CM | POA: Diagnosis present

## 2023-12-30 DIAGNOSIS — L03116 Cellulitis of left lower limb: Secondary | ICD-10-CM

## 2023-12-30 DIAGNOSIS — Z7982 Long term (current) use of aspirin: Secondary | ICD-10-CM | POA: Diagnosis not present

## 2023-12-30 DIAGNOSIS — Z6841 Body Mass Index (BMI) 40.0 and over, adult: Secondary | ICD-10-CM | POA: Diagnosis not present

## 2023-12-30 DIAGNOSIS — M86172 Other acute osteomyelitis, left ankle and foot: Secondary | ICD-10-CM | POA: Diagnosis present

## 2023-12-30 DIAGNOSIS — R7982 Elevated C-reactive protein (CRP): Secondary | ICD-10-CM | POA: Diagnosis present

## 2023-12-30 DIAGNOSIS — F419 Anxiety disorder, unspecified: Secondary | ICD-10-CM | POA: Diagnosis present

## 2023-12-30 DIAGNOSIS — J4489 Other specified chronic obstructive pulmonary disease: Secondary | ICD-10-CM | POA: Diagnosis present

## 2023-12-30 DIAGNOSIS — Z7951 Long term (current) use of inhaled steroids: Secondary | ICD-10-CM | POA: Diagnosis not present

## 2023-12-30 DIAGNOSIS — F17210 Nicotine dependence, cigarettes, uncomplicated: Secondary | ICD-10-CM | POA: Diagnosis present

## 2023-12-30 DIAGNOSIS — M109 Gout, unspecified: Secondary | ICD-10-CM | POA: Diagnosis present

## 2023-12-30 DIAGNOSIS — E871 Hypo-osmolality and hyponatremia: Secondary | ICD-10-CM | POA: Diagnosis present

## 2023-12-30 DIAGNOSIS — N1831 Chronic kidney disease, stage 3a: Secondary | ICD-10-CM | POA: Diagnosis present

## 2023-12-30 DIAGNOSIS — L03032 Cellulitis of left toe: Secondary | ICD-10-CM | POA: Diagnosis present

## 2023-12-30 DIAGNOSIS — R000 Tachycardia, unspecified: Secondary | ICD-10-CM | POA: Diagnosis present

## 2023-12-30 DIAGNOSIS — Z79899 Other long term (current) drug therapy: Secondary | ICD-10-CM | POA: Diagnosis not present

## 2023-12-30 DIAGNOSIS — E669 Obesity, unspecified: Secondary | ICD-10-CM | POA: Diagnosis present

## 2023-12-30 DIAGNOSIS — E872 Acidosis, unspecified: Secondary | ICD-10-CM | POA: Diagnosis present

## 2023-12-30 LAB — C-REACTIVE PROTEIN: CRP: 5.1 mg/dL — ABNORMAL HIGH (ref <1.0)

## 2023-12-30 LAB — CBC WITH DIFFERENTIAL/PLATELET
Abs Immature Granulocytes: 0.08 10*3/uL — ABNORMAL HIGH (ref 0.00–0.07)
Basophils Absolute: 0.1 10*3/uL (ref 0.0–0.1)
Basophils Relative: 1 %
Eosinophils Absolute: 0.1 10*3/uL (ref 0.0–0.5)
Eosinophils Relative: 1 %
HCT: 35.8 % — ABNORMAL LOW (ref 36.0–46.0)
Hemoglobin: 12.1 g/dL (ref 12.0–15.0)
Immature Granulocytes: 1 %
Lymphocytes Relative: 18 %
Lymphs Abs: 1.9 10*3/uL (ref 0.7–4.0)
MCH: 34.4 pg — ABNORMAL HIGH (ref 26.0–34.0)
MCHC: 33.8 g/dL (ref 30.0–36.0)
MCV: 101.7 fL — ABNORMAL HIGH (ref 80.0–100.0)
Monocytes Absolute: 1.5 10*3/uL — ABNORMAL HIGH (ref 0.1–1.0)
Monocytes Relative: 14 %
Neutro Abs: 7 10*3/uL (ref 1.7–7.7)
Neutrophils Relative %: 65 %
Platelets: 224 10*3/uL (ref 150–400)
RBC: 3.52 MIL/uL — ABNORMAL LOW (ref 3.87–5.11)
RDW: 12.1 % (ref 11.5–15.5)
WBC: 10.7 10*3/uL — ABNORMAL HIGH (ref 4.0–10.5)
nRBC: 0 % (ref 0.0–0.2)

## 2023-12-30 LAB — COMPREHENSIVE METABOLIC PANEL
ALT: 19 U/L (ref 0–44)
AST: 26 U/L (ref 15–41)
Albumin: 2.7 g/dL — ABNORMAL LOW (ref 3.5–5.0)
Alkaline Phosphatase: 91 U/L (ref 38–126)
Anion gap: 11 (ref 5–15)
BUN: 13 mg/dL (ref 8–23)
CO2: 24 mmol/L (ref 22–32)
Calcium: 8.6 mg/dL — ABNORMAL LOW (ref 8.9–10.3)
Chloride: 100 mmol/L (ref 98–111)
Creatinine, Ser: 1.25 mg/dL — ABNORMAL HIGH (ref 0.44–1.00)
GFR, Estimated: 48 mL/min — ABNORMAL LOW (ref >60)
Glucose, Bld: 98 mg/dL (ref 70–99)
Potassium: 3.9 mmol/L (ref 3.5–5.1)
Sodium: 135 mmol/L (ref 135–145)
Total Bilirubin: 0.9 mg/dL (ref 0.0–1.2)
Total Protein: 5.7 g/dL — ABNORMAL LOW (ref 6.5–8.1)

## 2023-12-30 LAB — LACTIC ACID, PLASMA
Lactic Acid, Venous: 2.1 mmol/L (ref 0.5–1.9)
Lactic Acid, Venous: 2.6 mmol/L (ref 0.5–1.9)
Lactic Acid, Venous: 3.5 mmol/L (ref 0.5–1.9)

## 2023-12-30 MED ORDER — HYDROMORPHONE HCL 1 MG/ML IJ SOLN
0.5000 mg | INTRAMUSCULAR | Status: DC | PRN
  Administered 2023-12-30 – 2024-01-03 (×9): 0.5 mg via INTRAVENOUS
  Filled 2023-12-30 (×6): qty 0.5
  Filled 2023-12-30: qty 1
  Filled 2023-12-30 (×3): qty 0.5

## 2023-12-30 MED ORDER — ONDANSETRON HCL 4 MG/2ML IJ SOLN: 4.0000 mg | Freq: Four times a day (QID) | INTRAMUSCULAR | Status: DC | PRN

## 2023-12-30 MED ORDER — GADOBUTROL 1 MMOL/ML IV SOLN
10.0000 mL | Freq: Once | INTRAVENOUS | Status: AC | PRN
  Administered 2023-12-30: 10 mL via INTRAVENOUS

## 2023-12-30 MED ORDER — SODIUM CHLORIDE 0.9 % IV SOLN
2.0000 g | Freq: Once | INTRAVENOUS | Status: AC
  Administered 2023-12-30: 2 g via INTRAVENOUS
  Filled 2023-12-30: qty 20

## 2023-12-30 MED ORDER — HYDROCODONE-ACETAMINOPHEN 5-325 MG PO TABS
1.0000 | ORAL_TABLET | Freq: Once | ORAL | Status: AC
  Administered 2023-12-30: 1 via ORAL
  Filled 2023-12-30: qty 1

## 2023-12-30 MED ORDER — ACETAMINOPHEN 650 MG RE SUPP: 650.0000 mg | Freq: Four times a day (QID) | RECTAL | Status: DC | PRN

## 2023-12-30 MED ORDER — SODIUM CHLORIDE 0.9% FLUSH
3.0000 mL | Freq: Two times a day (BID) | INTRAVENOUS | Status: DC
  Administered 2023-12-30 – 2024-01-03 (×9): 3 mL via INTRAVENOUS

## 2023-12-30 MED ORDER — PIPERACILLIN-TAZOBACTAM 3.375 G IVPB: 3.3750 g | Freq: Three times a day (TID) | INTRAVENOUS | Status: DC

## 2023-12-30 MED ORDER — PIPERACILLIN-TAZOBACTAM 3.375 G IVPB
3.3750 g | Freq: Three times a day (TID) | INTRAVENOUS | Status: DC
  Administered 2023-12-30 (×2): 3.375 g via INTRAVENOUS
  Filled 2023-12-30 (×2): qty 50

## 2023-12-30 MED ORDER — ENOXAPARIN SODIUM 40 MG/0.4ML IJ SOSY
40.0000 mg | PREFILLED_SYRINGE | INTRAMUSCULAR | Status: DC
  Administered 2023-12-30 – 2023-12-31 (×2): 40 mg via SUBCUTANEOUS
  Filled 2023-12-30 (×2): qty 0.4

## 2023-12-30 MED ORDER — ALBUTEROL SULFATE (2.5 MG/3ML) 0.083% IN NEBU: 2.5000 mg | INHALATION_SOLUTION | Freq: Four times a day (QID) | RESPIRATORY_TRACT | Status: DC | PRN

## 2023-12-30 MED ORDER — OXYCODONE HCL 5 MG PO TABS
5.0000 mg | ORAL_TABLET | ORAL | Status: DC | PRN
  Administered 2023-12-30 – 2024-01-03 (×14): 5 mg via ORAL
  Filled 2023-12-30 (×14): qty 1

## 2023-12-30 MED ORDER — ASPIRIN 81 MG PO TBEC
81.0000 mg | DELAYED_RELEASE_TABLET | Freq: Every day | ORAL | Status: DC
  Administered 2023-12-30 – 2024-01-03 (×5): 81 mg via ORAL
  Filled 2023-12-30 (×5): qty 1

## 2023-12-30 MED ORDER — FERROUS SULFATE 325 (65 FE) MG PO TABS
325.0000 mg | ORAL_TABLET | Freq: Every day | ORAL | Status: DC
  Administered 2023-12-30 – 2024-01-02 (×4): 325 mg via ORAL
  Filled 2023-12-30 (×5): qty 1

## 2023-12-30 MED ORDER — LACTATED RINGERS IV BOLUS
1000.0000 mL | Freq: Once | INTRAVENOUS | Status: AC
  Administered 2023-12-30: 1000 mL via INTRAVENOUS

## 2023-12-30 MED ORDER — ONDANSETRON HCL 4 MG PO TABS: 4.0000 mg | ORAL_TABLET | Freq: Four times a day (QID) | ORAL | Status: DC | PRN

## 2023-12-30 MED ORDER — MOMETASONE FURO-FORMOTEROL FUM 200-5 MCG/ACT IN AERO
2.0000 | INHALATION_SPRAY | Freq: Two times a day (BID) | RESPIRATORY_TRACT | Status: DC
  Administered 2023-12-31 – 2024-01-02 (×4): 2 via RESPIRATORY_TRACT
  Filled 2023-12-30 (×2): qty 8.8

## 2023-12-30 MED ORDER — FENTANYL CITRATE PF 50 MCG/ML IJ SOSY
50.0000 ug | PREFILLED_SYRINGE | Freq: Once | INTRAMUSCULAR | Status: AC
  Administered 2023-12-30: 50 ug via INTRAVENOUS
  Filled 2023-12-30: qty 1

## 2023-12-30 MED ORDER — VANCOMYCIN HCL 2000 MG/400ML IV SOLN
2000.0000 mg | Freq: Once | INTRAVENOUS | Status: AC
  Administered 2023-12-30: 2000 mg via INTRAVENOUS
  Filled 2023-12-30: qty 400

## 2023-12-30 MED ORDER — ACETAMINOPHEN 325 MG PO TABS
650.0000 mg | ORAL_TABLET | Freq: Four times a day (QID) | ORAL | Status: DC | PRN
  Administered 2024-01-01: 650 mg via ORAL
  Filled 2023-12-30: qty 2

## 2023-12-30 MED ORDER — VANCOMYCIN HCL IN DEXTROSE 1-5 GM/200ML-% IV SOLN
1000.0000 mg | INTRAVENOUS | Status: DC
  Filled 2023-12-30: qty 200

## 2023-12-30 MED ORDER — LISINOPRIL 20 MG PO TABS
20.0000 mg | ORAL_TABLET | Freq: Every day | ORAL | Status: DC
  Administered 2023-12-30 – 2023-12-31 (×2): 20 mg via ORAL
  Filled 2023-12-30 (×3): qty 1

## 2023-12-30 NOTE — Progress Notes (Signed)
 Pharmacy Antibiotic Note  Elaine Martinez is a 63 y.o. female admitted on 12/29/2023 presenting with foot pain, early osteomyelitis on MRI.  Pharmacy has been consulted for vancomycin  and zosyn  dosing.  Vancomycin  2g IV x 1 given in ED  Plan: Vancomycin  1g IV q 24h (eAUC 525) Zosyn  3.375g IV q 8h (extended 4h infusion) Monitor renal function, Cx and clinical progression to narrow Vancomycin  levels as indicated  Height: 5' 3 (160 cm) Weight: 105.7 kg (233 lb) IBW/kg (Calculated) : 52.4  Temp (24hrs), Avg:98.7 F (37.1 C), Min:98.5 F (36.9 C), Max:98.9 F (37.2 C)  Recent Labs  Lab 12/29/23 2137 12/29/23 2347 12/30/23 0056 12/30/23 0528 12/30/23 0840  WBC 9.8  --   --   --  10.7*  CREATININE 1.17*  --   --   --  1.25*  LATICACIDVEN 2.1* 3.5* 2.6* 2.1*  --     Estimated Creatinine Clearance: 53.6 mL/min (A) (by C-G formula based on SCr of 1.25 mg/dL (H)).    No Known Allergies  Dorn Poot, PharmD, [facility] Clinical Pharmacist ED Pharmacist Phone # [phone] 12/30/2023 11:12 AM

## 2023-12-30 NOTE — ED Notes (Signed)
 Patient transported to MRI

## 2023-12-30 NOTE — Progress Notes (Signed)
 ED Pharmacy Antibiotic Sign Off An antibiotic consult was received from an ED provider for Vancomycin   per pharmacy dosing for osteomyelitis. A chart review was completed to assess appropriateness.   The following one time order(s) were placed:  Vancomycin  2000 mg IV  Further antibiotic and/or antibiotic pharmacy consults should be ordered by the admitting provider if indicated.   Thank you for allowing pharmacy to be a part of this patient's care.   Dail Cordella Misty, [facility]  Clinical Pharmacist 12/30/23 6:13 AM

## 2023-12-30 NOTE — ED Notes (Signed)
 Patient assisted to bedside commode. Cleaned and dried afterwards.

## 2023-12-30 NOTE — ED Provider Notes (Signed)
 12:26 AM Patient c/o persistent pain. Additional dose of Norco ordered. Lactic acid trending from 2.1 > 3.5. will give additional IVF in the setting of persistent tachycardia; however no criteria for SIRS/sepsis currently as patient without tachypnea, leukocytosis. Question whether this may be artificially elevated if drawn of PIV. Will request RN complete lactic acid level again as straight stick.  MRI pending completion.  5:22 AM Called to request preliminary MRI interpretation.  5:50 AM Patient requesting additional pain medication. States dorsum of her L foot is throbbing. IV Fentanyl  ordered. Previously given 1 tablet of Norco x2.  6:07 AM MRI preliminary finding concerning for osteomyelitis. Abx ordered. Will consult for admission.  6:30 AM Care signed out to [place], [place] at shift change.   Keith Sor, PA-C 12/30/23 0630    Haze Lonni PARAS, MD 12/31/23 [phone]

## 2023-12-30 NOTE — H&P (Addendum)
 History and Physical    Patient: Elaine Martinez FMW:[id number] DOB: [birth year] DOA: 12/29/2023 DOS: the patient was seen and examined on 12/30/2023 PCP: Health, [facility]  Patient coming from: Home Medical readiness/disposition: Anticipate patient will be ready for discharge by 01/03/2024 and will discharge back to home.  Patient will need a work release note.  She works as a agricultural engineer.  Symptoms began about 72 hours prior to presentation.  Chief Complaint:  Chief Complaint  Patient presents with   Foot Pain    Foot pain x 2 days. Pt denies trauma. States that she is having swelling and pain to let foot. NV checks WNL mild swelling note to left foot.    HPI: Elaine Martinez is a 63 y.o. female with medical history significant of regular alcohol use, hypertension, obesity BMI 41.7 and tobacco abuse.  Patient presented to the ED with complaints of significant left foot pain and inability to weight-bear.  The ED she was afebrile.  He was hemodynamically stable.  White count was normal but she was noted to have elevated lactic acid.  ESR was elevated as was CRP.  Patient has had no wounds or other traumatic injuries or bites to the extremity.  She does not have any pins or other implants in the foot.  She has no skin lesions.  Plain x-ray and CT imaging of left foot were unable to determine if underlying osteomyelitis.  MRI was obtained that did demonstrate early signs of osteomyelitis.  Patient has been given IV Rocephin  and vancomycin  by the EDP.  Hospitalist service has been asked to evaluate the patient for admission.  Review of Systems: As mentioned in the history of present illness. All other systems reviewed and are negative. Past Medical History:  Diagnosis Date   Diverticulitis of intestine with abscess    Past Surgical History:  Procedure Laterality Date   HERNIA REPAIR     Social History:  reports that she has been smoking cigarettes. She started smoking about 50 years  ago. She has a 25.1 pack-year smoking history. She has never used smokeless tobacco. She reports current alcohol use. She reports that she does not use drugs.  No Known Allergies  History reviewed. No pertinent family history.  Prior to Admission medications   Medication Sig Start Date End Date Taking? Authorizing Provider  albuterol  (VENTOLIN  HFA) 108 (90 Base) MCG/ACT inhaler Inhale 2 puffs into the lungs every 6 (six) hours as needed for wheezing or shortness of breath. 09/16/23   Raenelle Coria, MD  aspirin  81 MG EC tablet Take 81 mg by mouth daily. 04/05/16   [provider]  Biotin 10 MG CAPS Take 10 mg by mouth 2 (two) times a week.    [provider]  clobetasol  ointment (TEMOVATE ) 0.05 % Apply 1 Application topically at bedtime as needed. Patient taking differently: Apply 1 Application topically as needed (rash). 10/17/22   Elnor Savant A, DO  Ferrous Sulfate  (IRON PO) Take 1 tablet by mouth daily.    [provider]  lisinopril -hydrochlorothiazide  (ZESTORETIC ) 20-25 MG tablet Take 1 tablet by mouth daily. 10/17/22   Elnor Savant LABOR, DO  mometasone -formoterol  (DULERA ) 200-5 MCG/ACT AERO Inhale 2 puffs into the lungs 2 (two) times daily. 09/16/23   Ghimire, Kuber, MD  Multiple Vitamin (MULTI-VITAMIN) tablet Take 1 tablet by mouth daily.    [provider]  nicotine  (NICODERM CQ  - DOSED IN MG/24 HOURS) 21 mg/24hr patch Place 1 patch (21 mg total) onto the skin daily.  09/17/23   Ghimire, Kuber, MD  VITAMIN E PO Take 1 capsule by mouth daily.    [provider]    Physical Exam: Vitals:   12/30/23 0242 12/30/23 0544 12/30/23 0628 12/30/23 0700  BP: 128/75 135/85  (!) 131/90  Pulse: 82 100  (!) 105  Resp: 18 18    Temp: 98.7 F (37.1 C)  98.9 F (37.2 C)   TempSrc: Oral  Oral   SpO2: 100% 95%  98%  Weight:      Height:       Constitutional: NAD, calm, comfortable Eyes: PERRL, lids and conjunctivae normal ENMT: Mucous membranes are  moist. Posterior pharynx clear of any exudate or lesions.Normal dentition.  Neck: normal, supple, no masses, no thyromegaly Respiratory: clear to auscultation bilaterally, no wheezing, no crackles. Normal respiratory effort. No accessory muscle use.  Cardiovascular: Regular rate and rhythm, no murmurs / rubs / gallops. No extremity edema. 2+ pedal pulses. Abdomen: no tenderness, no masses palpated. No hepatosplenomegaly. Bowel sounds positive.  Musculoskeletal: no clubbing / cyanosis. No joint deformity upper and lower extremities. Good ROM, no contractures. Normal muscle tone.  Skin: no rashes, lesions, ulcers. No induration Neurologic: CN 2-12 grossly intact. Sensation intact, Strength 5/5 x all 4 extremities.  Psychiatric: Normal judgment and insight. Alert and oriented x 3. Normal mood.    Data Reviewed:  Labs from presentation: Sodium 134, potassium 3.4, glucose 109, BUN 14, creatinine 1.17 with a GFR 52, LFTs normal  Lactic acid trend: 2.1-3.5-2.6-2.1 CRP 3.7, ESR 33  WBC 9.8 with normal differential, hemoglobin 13.6, MCV 100.5, platelets 262,000  Imaging as aboveq  Assessment and Plan: Osteomyelitis left foot Unclear precipitating event noting no obvious trauma and no underlying implants Medical management at this point with IV antibiotics and symptom management IV vancomycin  and Zosyn  pharmacy dosing Combination of oral and IV narcotics for pain control PT evaluation to assist with mobility  Elevated lactic acid with normal anion gap Secondary to acute infection and underlying chronic kidney disease Follow labs  Daily alcohol use Does have mildly elevated MCV as well as low sodium and potassium and low albumin Follow CIWA score and can initiate CIWA Ativan protocol if develops signs of withdrawal Patient reports 2 to 3 cups of wine daily  Mild hyponatremia and hypokalemia Likely from preadmission thiazide diuretic which we will hold during acute illness  CKD  IIIa Baseline GFR between 45 and 52 Renal function at baseline  Hypertension Continue lisinopril  without thiazide diuretic  Tobacco abuse Appears to have underlying asthmatic or COPD disease Continue nicotine  patch along with Dulera  and as needed albuterol  MDI  Obesity BMI 41 Weight management per PCP Does have low albumin    Advance Care Planning:   Code Status: Full Code   VTE prophylaxis: Lovenox   Consults: None  Family Communication: Patient only  Severity of Illness: The appropriate patient status for this patient is INPATIENT. Inpatient status is judged to be reasonable and necessary in order to provide the required intensity of service to ensure the patient's safety. The patient's presenting symptoms, physical exam findings, and initial radiographic and laboratory data in the context of their chronic comorbidities is felt to place them at high risk for further clinical deterioration. Furthermore, it is not anticipated that the patient will be medically stable for discharge from the hospital within 2 midnights of admission.   * I certify that at the point of admission it is my clinical judgment that the patient will require inpatient hospital care  spanning beyond 2 midnights from the point of admission due to high intensity of service, high risk for further deterioration and high frequency of surveillance required.*  Author: Isaiah Lever, NP 12/30/2023 8:42 AM  For on call review [email or web address].

## 2023-12-31 DIAGNOSIS — M86172 Other acute osteomyelitis, left ankle and foot: Secondary | ICD-10-CM | POA: Diagnosis not present

## 2023-12-31 DIAGNOSIS — L03032 Cellulitis of left toe: Secondary | ICD-10-CM

## 2023-12-31 LAB — COMPREHENSIVE METABOLIC PANEL
ALT: 19 U/L (ref 0–44)
AST: 29 U/L (ref 15–41)
Albumin: 3.1 g/dL — ABNORMAL LOW (ref 3.5–5.0)
Alkaline Phosphatase: 102 U/L (ref 38–126)
Anion gap: 13 (ref 5–15)
BUN: 17 mg/dL (ref 8–23)
CO2: 24 mmol/L (ref 22–32)
Calcium: 9.2 mg/dL (ref 8.9–10.3)
Chloride: 101 mmol/L (ref 98–111)
Creatinine, Ser: 1.46 mg/dL — ABNORMAL HIGH (ref 0.44–1.00)
GFR, Estimated: 40 mL/min — ABNORMAL LOW (ref >60)
Glucose, Bld: 119 mg/dL — ABNORMAL HIGH (ref 70–99)
Potassium: 3.7 mmol/L (ref 3.5–5.1)
Sodium: 138 mmol/L (ref 135–145)
Total Bilirubin: 0.5 mg/dL (ref 0.0–1.2)
Total Protein: 6.9 g/dL (ref 6.5–8.1)

## 2023-12-31 LAB — CBC
HCT: 40.1 % (ref 36.0–46.0)
Hemoglobin: 13.2 g/dL (ref 12.0–15.0)
MCH: 34.1 pg — ABNORMAL HIGH (ref 26.0–34.0)
MCHC: 32.9 g/dL (ref 30.0–36.0)
MCV: 103.6 fL — ABNORMAL HIGH (ref 80.0–100.0)
Platelets: 243 10*3/uL (ref 150–400)
RBC: 3.87 MIL/uL (ref 3.87–5.11)
RDW: 12.1 % (ref 11.5–15.5)
WBC: 11 10*3/uL — ABNORMAL HIGH (ref 4.0–10.5)
nRBC: 0 % (ref 0.0–0.2)

## 2023-12-31 LAB — URIC ACID: Uric Acid, Serum: 12.3 mg/dL — ABNORMAL HIGH (ref 2.5–7.1)

## 2023-12-31 MED ORDER — POLYETHYLENE GLYCOL 3350 17 G PO PACK
17.0000 g | PACK | Freq: Every day | ORAL | Status: DC | PRN
  Administered 2024-01-01: 17 g via ORAL
  Filled 2023-12-31: qty 1

## 2023-12-31 MED ORDER — VANCOMYCIN HCL 1250 MG/250ML IV SOLN
1250.0000 mg | INTRAVENOUS | Status: DC
  Administered 2023-12-31 – 2024-01-02 (×2): 1250 mg via INTRAVENOUS
  Filled 2023-12-31 (×2): qty 250

## 2023-12-31 MED ORDER — PREDNISONE 10 MG PO TABS
50.0000 mg | ORAL_TABLET | Freq: Every day | ORAL | Status: DC
  Administered 2023-12-31 – 2024-01-02 (×3): 50 mg via ORAL
  Filled 2023-12-31 (×3): qty 5

## 2023-12-31 MED ORDER — KETOROLAC TROMETHAMINE 15 MG/ML IJ SOLN
15.0000 mg | INTRAMUSCULAR | Status: AC
  Administered 2023-12-31: 15 mg via INTRAVENOUS
  Filled 2023-12-31: qty 1

## 2023-12-31 MED ORDER — SODIUM CHLORIDE 0.9 % IV SOLN
2.0000 g | Freq: Every day | INTRAVENOUS | Status: DC
  Administered 2023-12-31 – 2024-01-02 (×3): 2 g via INTRAVENOUS
  Filled 2023-12-31 (×3): qty 20

## 2023-12-31 NOTE — Progress Notes (Signed)
 Orthopedic Tech Progress Note Patient Details:  Elaine Martinez [birth year] [id number]  Ortho Devices Type of Ortho Device: Postop shoe/boot Ortho Device/Splint Interventions: Ordered   Post Interventions Instructions Provided: Care of device, Adjustment of device  [name] A [name] 12/31/2023, 1:09 PM

## 2023-12-31 NOTE — Progress Notes (Signed)
 Pharmacy Antibiotic Note  Elaine Martinez is a 63 y.o. female admitted on 12/29/2023 with left foot pain and possible early osteomyelitis. Pharmacy consulted for Vancomycin  and Zosyn  dosing.  Zosyn  changed to Ceftriaxone  this morning. Small foreign body per MRI and no convincing evidence of osteo of great toe or forefoot.  Podiatry to see.  Creatinine trended up. Vanc 2gm IV loading dose given at 0648 on 1/11 and  Vanc 1gm IV q24h regimen was to begin this am.   Plan: Change empiric Vancomycin  to 1250 mg IV Q 36 hrs. Begin this evening. Goal AUC 400-550. Expected AUC: 501 SCr used: 1.46 and Vd 0.5L/kg for BMI >30 Follow renal function, culture data, clinical progress. Follow up Podiatry input, antibiotic plans.   Height: 5' 3 (160 cm) Weight: 105.7 kg (233 lb) IBW/kg (Calculated) : 52.4  Temp (24hrs), Avg:98.5 F (36.9 C), Min:98.1 F (36.7 C), Max:99.2 F (37.3 C)  Recent Labs  Lab 12/29/23 2137 12/29/23 2347 12/30/23 0056 12/30/23 0528 12/30/23 0840 12/31/23 0308  WBC 9.8  --   --   --  10.7* 11.0*  CREATININE 1.17*  --   --   --  1.25* 1.46*  LATICACIDVEN 2.1* 3.5* 2.6* 2.1*  --   --     Estimated Creatinine Clearance: 45.9 mL/min (A) (by C-G formula based on SCr of 1.46 mg/dL (H)).    No Known Allergies  Antimicrobials this admission: Vancomycin  1/11 >> Ceftriaxone  1/11>> Zosyn  1/11 x 2 doses  Dose adjustments this admission: 1/12: empiric vanc regimen modified for increased creatinine  Microbiology results: 1/12 blood: sent MRSA PCR: ordered  Thank you for allowing pharmacy to be a part of this patient's care.  Genaro Zebedee Calin. RPh 12/31/2023 9:46 AM

## 2023-12-31 NOTE — Progress Notes (Signed)
 PROGRESS NOTE  Elaine Martinez  FMW:[id number] DOB: February 02, 1961 DOA: 12/29/2023 PCP: Health, [facility]   Brief Narrative: Patient is a 63 year old female with history of lupus, hypertension, obesity, tobacco use who presented with left foot pain, unable to bear weight.  On presentation, she was hemodynamically stable.  Lab work showed elevated lactate level, elevated CRP, ESR.  X-ray, CT imaging of left foot positive for osteomyelitis.  MRI showed early signs of osteomyelitis.  Started on broad-spectrum antibiotics.  Podiatry consulted  Assessment & Plan:  Principal Problem:   Acute osteomyelitis of left foot (HCC)   Cellulitis  of left foot: Presented with left foot pain, edema, unable to bear weight.  No history of trauma but she walks barefoot at home and has wooden stairs  in poor condition.  Lab work showed elevated lactate level, elevated CRP, ESR.  X-ray, CT imaging of left foot positive for osteomyelitis.  MRI showed   small foreign body within the plantar-lateral soft tissues of the great toe at the level of the IP joint. No convincing evidence of osteomyelitis of the great toe or forefoot. Soft tissue swelling and edema of the dorsal foot. No organized or rim enhancing fluid collections.  Started on broad-spectrum antibiotics.  Podiatry consulted.  Follow-up cultures. Podiatry recommended conservative management with antibiotics, no plan for surgical intervention.  Weightbearing as tolerated Patient also complains of pain on the right foot today.  On examination, there is no erythema or edema of the right foot  Suspected sepsis: Elevated lactate on presentation.  No fever or leukocytosis.  Continue to follow-up cultures and continue current antibiotics  CKD stage IIIa: Currently kidney function at baseline.  Chronic alcohol abuse: Monitor CIWA.  Continue thiamine folic acid  Hypertension: Continue lisinopril .  Hydrochlorothiazide  on hold  Tobacco use: Continue nicotine   patch.  Asthma/COPD: Continue bronchodilators as needed.  Currently not on exacerbation  Obesity: BMI of 41.2        DVT prophylaxis:enoxaparin  (LOVENOX ) injection 40 mg Start: 12/30/23 0730     Code Status: Full Code  Family Communication: None at the bedside  Patient status:Inpatient  Patient is from :Home  Anticipated discharge un:Ynfz  Estimated DC date:1-2 days   Consultants: Podiatry  Procedures:None  Antimicrobials:  Anti-infectives (From admission, onward)    Start     Dose/Rate Route Frequency Ordered Stop   12/31/23 0800  vancomycin  (VANCOCIN ) IVPB 1000 mg/200 mL premix        1,000 mg 200 mL/hr over 60 Minutes Intravenous Every 24 hours 12/30/23 1115     12/30/23 1400  piperacillin -tazobactam (ZOSYN ) IVPB 3.375 g  Status:  Discontinued        3.375 g 12.5 mL/hr over 240 Minutes Intravenous Every 8 hours 12/30/23 1010 12/30/23 1011   12/30/23 1015  piperacillin -tazobactam (ZOSYN ) IVPB 3.375 g        3.375 g 12.5 mL/hr over 240 Minutes Intravenous Every 8 hours 12/30/23 1011     12/30/23 0615  cefTRIAXone  (ROCEPHIN ) 2 g in sodium chloride  0.9 % 100 mL IVPB        2 g 200 mL/hr over 30 Minutes Intravenous  Once 12/30/23 0609 12/30/23 0646   12/30/23 0615  vancomycin  (VANCOREADY) IVPB 2000 mg/400 mL        2,000 mg 200 mL/hr over 120 Minutes Intravenous  Once 12/30/23 0612 12/30/23 1206       Subjective: Patient seen and examined at bedside today.  During my evaluation, she looks overall comfortable.  Lying in bed.  Her pain on the left foot has improved.  Swelling persist.  She complains of right foot pain today.  Objective: Vitals:   12/30/23 1606 12/30/23 2054 12/31/23 0644 12/31/23 0731  BP: 109/69 105/70 116/71 103/66  Pulse: (!) 106 95 97 (!) 102  Resp: 18 18 18 16   Temp: 99.2 F (37.3 C) 98.5 F (36.9 C) 98.1 F (36.7 C) 98.2 F (36.8 C)  TempSrc: Oral Oral Oral Oral  SpO2: 98% 98% 94% 100%  Weight:      Height:         Intake/Output Summary (Last 24 hours) at 12/31/2023 0754 Last data filed at 12/30/2023 2248 Gross per 24 hour  Intake 50 ml  Output --  Net 50 ml   Filed Weights   12/29/23 1944  Weight: 105.7 kg    Examination:  General exam: Overall comfortable, not in distress, morbidly obese HEENT: PERRL Respiratory system:  no wheezes or crackles  Cardiovascular system: S1 & S2 heard, RRR.  Gastrointestinal system: Abdomen is nondistended, soft and nontender. Central nervous system: Alert and oriented Extremities:no clubbing ,no cyanosis, tenderness/edema of the left foot, Skin: No rashes, no ulcers,no icterus     Data Reviewed: I have personally reviewed following labs and imaging studies  CBC: Recent Labs  Lab 12/29/23 2137 12/30/23 0840 12/31/23 0308  WBC 9.8 10.7* 11.0*  NEUTROABS 6.1 7.0  --   HGB 13.6 12.1 13.2  HCT 39.9 35.8* 40.1  MCV 100.5* 101.7* 103.6*  PLT 262 224 243   Basic Metabolic Panel: Recent Labs  Lab 12/29/23 2137 12/30/23 0840 12/31/23 0308  NA 134* 135 138  K 3.4* 3.9 3.7  CL 100 100 101  CO2 21* 24 24  GLUCOSE 109* 98 119*  BUN 14 13 17   CREATININE 1.17* 1.25* 1.46*  CALCIUM 9.2 8.6* 9.2     No results found for this or any previous visit (from the past 240 hours).   Radiology Studies: MR FOOT LEFT W WO CONTRAST Result Date: 12/30/2023 CLINICAL DATA:  Left foot pain EXAM: MRI OF THE LEFT FOREFOOT WITHOUT AND WITH CONTRAST TECHNIQUE: Multiplanar, multisequence MR imaging of the left forefoot was performed both before and after administration of intravenous contrast. CONTRAST:  10mL GADAVIST  GADOBUTROL  1 MMOL/ML IV SOLN COMPARISON:  X-ray and CT from 12/29/2023 FINDINGS: Bones/Joint/Cartilage Small foreign body within the plantar-lateral soft tissues of the great toe at the level of the IP joint with resultant susceptibility artifact. This creates alterations in the degree of fat saturation in this area. There is no convincing evidence of bone  marrow edema of the great toe. No marrow replacement. Chronic appearing deformity of the distal phalanx of the great toe. Bipartite tibial hallux sesamoid. Mild-to-moderate degenerative changes are most pronounced at the TMT joint level where there is mild reactive subchondral bone marrow edema. Small joint effusions of the first through fourth MTP joints, nonspecific. No fracture or dislocation. Ligaments Intact Lisfranc ligament.  Intact collateral ligaments. Muscles and Tendons No acute musculotendinous abnormality by CT.  No tenosynovitis. Soft tissues Soft tissue swelling and edema of the dorsal foot. No organized or rim enhancing fluid collections. IMPRESSION: 1. Small foreign body within the plantar-lateral soft tissues of the great toe at the level of the IP joint. No convincing evidence of osteomyelitis of the great toe or forefoot. 2. Soft tissue swelling and edema of the dorsal foot. No organized or rim enhancing fluid collections. 3. Mild-to-moderate degenerative changes of the midfoot. Electronically Signed   By: Mabel  Plundo D.O.   On: 12/30/2023 10:47   CT FOOT LEFT W CONTRAST Result Date: 12/29/2023 CLINICAL DATA:  Osteomyelitis suspected, foot, xray done concern is nec fasc, has tenderness of mid foot where edema noted. EXAM: CT OF THE LOWER LEFT EXTREMITY WITH CONTRAST TECHNIQUE: Multidetector CT imaging of the lower left extremity was performed according to the standard protocol following intravenous contrast administration. RADIATION DOSE REDUCTION: This exam was performed according to the departmental dose-optimization program which includes automated exposure control, adjustment of the mA and/or kV according to patient size and/or use of iterative reconstruction technique. CONTRAST:  75mL OMNIPAQUE  IOHEXOL  350 MG/ML SOLN COMPARISON:  X-ray left foot 12/29/2023 FINDINGS: Bones/Joint/Cartilage Vague cortical irregularity along the dorsal aspect of the first digit distal phalangeal tuft. No  evidence of fracture, dislocation, or joint effusion. No evidence of severe arthropathy. No aggressive appearing focal bone abnormality. Soft tissues are unremarkable. Ligaments Suboptimally assessed by CT. Muscles and Tendons Grossly unremarkable. Soft tissues Dorsal midfoot subcutaneus soft tissue edema with no CT evidence of emphysema-likely artifactual on x-ray due to overlying soft tissue. No retained radiopaque foreign body. No organized fluid collection. Vascular: Grossly unremarkable. IMPRESSION: Vague cortical irregularity along the dorsal aspect of the first digit distal phalangeal tuft. Finding correlates with retrospectively similar finding on x-ray left foot 12/29/2023. If concern for osteomyelitis, consider MRI with and without contrast for further evaluation. Electronically Signed   By: Morgane  Naveau M.D.   On: 12/29/2023 23:01   DG Foot Complete Left Addendum Date: 12/29/2023 ADDENDUM REPORT: 12/29/2023 22:58 ADDENDUM: Vague cortical irregularity along the dorsal aspect of the first digit distal phalangeal tuft. Electronically Signed   By: Morgane  Naveau M.D.   On: 12/29/2023 22:58   Result Date: 12/29/2023 CLINICAL DATA:  Pain and swelling EXAM: LEFT FOOT - COMPLETE 3+ VIEW COMPARISON:  None Available. FINDINGS: There is no evidence of fracture or dislocation. There is no evidence of severe arthropathy or other focal bone abnormality. 2 mm density overlies the plantar soft tissues of the distal first digit. Subcutaneus soft tissue edema of the dorsal midfoot with query emphysema. IMPRESSION: 1. A 2 mm density overlies the plantar soft tissues of the distal first digit. Query retained radiopaque foreign body. Correlate with physical exam. 2. Subcutaneus soft tissue edema of the dorsal midfoot with query emphysema. Electronically Signed: By: Morgane  Naveau M.D. On: 12/29/2023 20:15    Scheduled Meds:  aspirin  EC  81 mg Oral Daily   enoxaparin  (LOVENOX ) injection  40 mg Subcutaneous Q24H    ferrous sulfate   325 mg Oral Daily   lisinopril   20 mg Oral Daily   mometasone -formoterol   2 puff Inhalation BID   sodium chloride  flush  3 mL Intravenous Q12H   Continuous Infusions:  piperacillin -tazobactam (ZOSYN )  IV 3.375 g (12/30/23 2248)   vancomycin        LOS: 1 day   Ivonne Mustache, MD [facility] P1/11/2024, 7:54 AM

## 2023-12-31 NOTE — Consult Note (Signed)
 PODIATRY CONSULTATION  NAME Elaine Martinez MRN [id number] DOB [birth year] DOA 12/29/2023   Reason for consult:  Chief Complaint  Patient presents with   Foot Pain    Foot pain x 2 days. Pt denies trauma. States that she is having swelling and pain to let foot. NV checks WNL mild swelling note to left foot.     Attending/Consulting physician: A. Adhikari MD  History of present illness: Lubna Stegeman is a 63 y.o. female with medical history significant of regular alcohol use, hypertension, obesity BMI 41.7 and tobacco abuse.  Patient presented to the ED with complaints of significant left foot pain and inability to weight-bear.  The ED she was afebrile.  He was hemodynamically stable.  White count was normal but she was noted to have elevated lactic acid.  ESR was elevated as was CRP.  Patient has had no wounds or other traumatic injuries or bites to the extremity.  She does not have any pins or other implants in the foot.  She has no skin lesions.  Plain x-ray and CT imaging of left foot were unable to determine if underlying osteomyelitis.  MRI was obtained that did demonstrate early signs of osteomyelitis.  Patient has been given IV Rocephin  and vancomycin  by the EDP.  Hospitalist service has been asked to evaluate the patient for admission.  I discussed with the patient he pain - she states getting better since yesterday, now able to put some pressure on left foot. Also notes some pain in right arch. She denies any wounds to her left great toe recently.    Past Medical History:  Diagnosis Date   Diverticulitis of intestine with abscess        Latest Ref Rng & Units 12/31/2023    3:08 AM 12/30/2023    8:40 AM 12/29/2023    9:37 PM  CBC  WBC 4.0 - 10.5 K/uL 11.0  10.7  9.8   Hemoglobin 12.0 - 15.0 g/dL 86.7  87.8  86.3   Hematocrit 36.0 - 46.0 % 40.1  35.8  39.9   Platelets 150 - 400 K/uL 243  224  262        Latest Ref Rng & Units 12/31/2023    3:08 AM 12/30/2023    8:40 AM  12/29/2023    9:37 PM  BMP  Glucose 70 - 99 mg/dL 880  98  890   BUN 8 - 23 mg/dL 17  13  14    Creatinine 0.44 - 1.00 mg/dL 8.53  8.74  8.82   Sodium 135 - 145 mmol/L 138  135  134   Potassium 3.5 - 5.1 mmol/L 3.7  3.9  3.4   Chloride 98 - 111 mmol/L 101  100  100   CO2 22 - 32 mmol/L 24  24  21    Calcium 8.9 - 10.3 mg/dL 9.2  8.6  9.2       Physical Exam: Lower Extremity Exam Vasc: R - PT palpable, DP palpable. Cap refill < 3 sec to digits  L - PT palpable, DP palpable. Cap refill <3 sec to digits  Derm: R - Normal temp/texture/turgor with no open lesion or clinical signs of infection    L - 1st mpj with mild edema, no significant erythema. No open wound plantar IPJ.  MSK:  R -  No gross deformities.  Pain with palpatino along plantar medial arch midfoot.   L - Mild edema and tenderness about the left hallux MPJ and IPJ but no  crepitus or fluid collection on exam.   Neuro: R - Gross sensation intact. Gross motor function intact   L - Gross sensation intact. Gross motor function intact    ASSESSMENT/PLAN OF CARE 63 y.o. female with PMHx significant for  regular alcohol use, hypertension, obesity BMI 41.7 and tobacco abuse  with cellulitis vs gout left foot. No definitive radiographic or clinical evidence of osteomyelitis.   WBC 11 CRP 5.1, ESR 33  MRI left foot with and without contrast: Small foreign body within the plantar lateral soft tissues the great toe at the level of the IPJ - no evidence of osteomyelitis of the great toe.   - No surgical plans. Plan to monitor clinical improvement which we are seeing with IV abx. PT OT. Do not feel she has acute OM in the left hallux distal phalanx at this time. Discussed with Dr. Lenell with radiology, any erosions seen on imaging though to represent chronic change vs deformity of the tuft not acute OM.  - Continue IV abx broad spectrum for now, then as improvement allows transition to PO regimen - Anticoagulation: continue - Wound  care: None req - WB status: WBAT to Left foot in post op shoe, ordered - Will sign off, please reach out with questions or concerns or if patients clinical status worsens.    Thank you for the consult.  Please contact me directly with any questions or concerns.           Marolyn JULIANNA Honour, DPM [facility] / [facility]    [address] [place], [place] [address]                Office [phone]  Fax [phone]

## 2024-01-01 ENCOUNTER — Telehealth: Payer: Self-pay | Admitting: Podiatry

## 2024-01-01 DIAGNOSIS — M86172 Other acute osteomyelitis, left ankle and foot: Secondary | ICD-10-CM | POA: Diagnosis not present

## 2024-01-01 LAB — CBC
HCT: 38.6 % (ref 36.0–46.0)
Hemoglobin: 12.9 g/dL (ref 12.0–15.0)
MCH: 34.2 pg — ABNORMAL HIGH (ref 26.0–34.0)
MCHC: 33.4 g/dL (ref 30.0–36.0)
MCV: 102.4 fL — ABNORMAL HIGH (ref 80.0–100.0)
Platelets: 239 10*3/uL (ref 150–400)
RBC: 3.77 MIL/uL — ABNORMAL LOW (ref 3.87–5.11)
RDW: 11.8 % (ref 11.5–15.5)
WBC: 10.5 10*3/uL (ref 4.0–10.5)
nRBC: 0 % (ref 0.0–0.2)

## 2024-01-01 LAB — BASIC METABOLIC PANEL
Anion gap: 13 (ref 5–15)
BUN: 23 mg/dL (ref 8–23)
CO2: 21 mmol/L — ABNORMAL LOW (ref 22–32)
Calcium: 9.1 mg/dL (ref 8.9–10.3)
Chloride: 104 mmol/L (ref 98–111)
Creatinine, Ser: 1.47 mg/dL — ABNORMAL HIGH (ref 0.44–1.00)
GFR, Estimated: 40 mL/min — ABNORMAL LOW (ref >60)
Glucose, Bld: 164 mg/dL — ABNORMAL HIGH (ref 70–99)
Potassium: 3.8 mmol/L (ref 3.5–5.1)
Sodium: 138 mmol/L (ref 135–145)

## 2024-01-01 MED ORDER — ENOXAPARIN SODIUM 40 MG/0.4ML IJ SOSY
40.0000 mg | PREFILLED_SYRINGE | INTRAMUSCULAR | Status: DC
  Administered 2024-01-01 – 2024-01-02 (×2): 40 mg via SUBCUTANEOUS
  Filled 2024-01-01 (×2): qty 0.4

## 2024-01-01 MED ORDER — MELATONIN 5 MG PO TABS
5.0000 mg | ORAL_TABLET | Freq: Every evening | ORAL | Status: DC | PRN
  Administered 2024-01-02 (×2): 5 mg via ORAL
  Filled 2024-01-01 (×2): qty 1

## 2024-01-01 NOTE — Plan of Care (Signed)
 Patient alert/oriented X4. Patient compliant with medication administration and administered oxycodone  as needed for L foot pain. Patient complains of shortness of breath transferring from bedside commode to bed and vise versa. Patient tolerated IV abx and VSS. Will continue to monitor.  Problem: Education: Goal: Knowledge of General Education information will improve Description: Including pain rating scale, medication(s)/side effects and non-pharmacologic comfort measures Outcome: Progressing   Problem: Health Behavior/Discharge Planning: Goal: Ability to manage health-related needs will improve Outcome: Progressing   Problem: Clinical Measurements: Goal: Ability to maintain clinical measurements within normal limits will improve Outcome: Progressing   Problem: Clinical Measurements: Goal: Will remain free from infection Outcome: Progressing   Problem: Clinical Measurements: Goal: Diagnostic test results will improve Outcome: Progressing   Problem: Clinical Measurements: Goal: Respiratory complications will improve Outcome: Progressing   Problem: Clinical Measurements: Goal: Cardiovascular complication will be avoided Outcome: Progressing   Problem: Activity: Goal: Risk for activity intolerance will decrease Outcome: Progressing   Problem: Nutrition: Goal: Adequate nutrition will be maintained Outcome: Progressing   Problem: Coping: Goal: Level of anxiety will decrease Outcome: Progressing   Problem: Elimination: Goal: Will not experience complications related to urinary retention Outcome: Progressing   Problem: Pain Management: Goal: General experience of comfort will improve Outcome: Progressing   Problem: Safety: Goal: Ability to remain free from injury will improve Outcome: Progressing   Problem: Skin Integrity: Goal: Risk for impaired skin integrity will decrease Outcome: Progressing

## 2024-01-01 NOTE — Plan of Care (Signed)

## 2024-01-01 NOTE — Progress Notes (Signed)
 PROGRESS NOTE  Elaine Martinez  FMW:[id number] DOB: 1961-01-01 DOA: 12/29/2023 PCP: [facility]   Brief Narrative: Patient is a 63 year old female with history of lupus, hypertension, obesity, tobacco use who presented with left foot pain, unable to bear weight.  On presentation, she was hemodynamically stable.  Lab work showed elevated lactate level, elevated CRP, ESR.  X-ray, CT imaging / MRI reviewed along with podiatry, no suspicion for osteomyelitis.  Uric acid  also elevated, started on prednisone  .Currently on broad-spectrum antibiotics for left foot cellulitis.  This is a evaluation pending  Assessment & Plan:  Principal Problem:   Acute osteomyelitis of left foot (HCC) Active Problems:   Cellulitis of great toe of left foot   Cellulitis  of left foot: Presented with left foot pain, edema, unable to bear weight.  No history of trauma but she walks barefoot at home and has wooden stairs  in poor condition.  Lab work showed elevated lactate level, elevated CRP, ESR.  Imagings with  no convincing evidence of osteomyelitis of the great toe or forefoot. Soft tissue swelling and edema of the dorsal foot. No organized or rim enhancing fluid collections. Started on broad-spectrum antibiotics.  Podiatry consulted. Cx negative Podiatry recommended conservative management with antibiotics, no plan for surgical intervention.  Weightbearing as tolerated  Gout: Right foot also appeared swollen on 1/12, suspicion for gout.  Started on prednisone   Suspected sepsis: Elevated lactate on presentation.  No fever or leukocytosis.  Continue to follow-up cultures and continue current antibiotics  CKD stage IIIa: Currently kidney function at baseline.  Chronic alcohol abuse: Monitor CIWA.  Continue thiamine folic acid  Hypertension: BP soft. Home isinopril- Hydrochlorothiazide  on hold  Tobacco use: Continue nicotine  patch.  Asthma/COPD: Continue bronchodilators as needed.  Currently not  on exacerbation  Obesity: BMI of 41.2        DVT prophylaxis:Lovenox      Code Status: Full Code  Family Communication: None at the bedside  Patient status:Inpatient  Patient is from :Home  Anticipated discharge un:Ynfz  Estimated DC date:tomorrow   Consultants: Podiatry  Procedures:None  Antimicrobials:  Anti-infectives (From admission, onward)    Start     Dose/Rate Route Frequency Ordered Stop   12/31/23 1800  vancomycin  (VANCOREADY) IVPB 1250 mg/250 mL        1,250 mg 166.7 mL/hr over 90 Minutes Intravenous Every 36 hours 12/31/23 0946     12/31/23 1000  cefTRIAXone  (ROCEPHIN ) 2 g in sodium chloride  0.9 % 100 mL IVPB        2 g 200 mL/hr over 30 Minutes Intravenous Daily 12/31/23 0802     12/31/23 0800  vancomycin  (VANCOCIN ) IVPB 1000 mg/200 mL premix  Status:  Discontinued        1,000 mg 200 mL/hr over 60 Minutes Intravenous Every 24 hours 12/30/23 1115 12/31/23 0946   12/30/23 1400  piperacillin -tazobactam (ZOSYN ) IVPB 3.375 g  Status:  Discontinued        3.375 g 12.5 mL/hr over 240 Minutes Intravenous Every 8 hours 12/30/23 1010 12/30/23 1011   12/30/23 1015  piperacillin -tazobactam (ZOSYN ) IVPB 3.375 g  Status:  Discontinued        3.375 g 12.5 mL/hr over 240 Minutes Intravenous Every 8 hours 12/30/23 1011 12/31/23 0802   12/30/23 0615  cefTRIAXone  (ROCEPHIN ) 2 g in sodium chloride  0.9 % 100 mL IVPB        2 g 200 mL/hr over 30 Minutes Intravenous  Once 12/30/23 0609 12/30/23 0646   12/30/23  9384  vancomycin  (VANCOREADY) IVPB 2000 mg/400 mL        2,000 mg 200 mL/hr over 120 Minutes Intravenous  Once 12/30/23 0612 12/30/23 1206       Subjective: Patient seen and examined at bedside today.  Hemodynamically stable.  Lying in bed.  Left foot pain is significantly better today.  Still has some right foot discomfort but feels better overall.  Objective: Vitals:   12/31/23 2110 01/01/24 0641 01/01/24 0734 01/01/24 0900  BP: 137/78 104/70 (!) 89/58    Pulse: 86 90 (!) 103 98  Resp: 18 18 16 17   Temp: 97.9 F (36.6 C) 97.7 F (36.5 C)    TempSrc: Oral Oral    SpO2: 99% 99% 99% 98%  Weight:      Height:        Intake/Output Summary (Last 24 hours) at 01/01/2024 1320 Last data filed at 01/01/2024 0300 Gross per 24 hour  Intake 350 ml  Output --  Net 350 ml   Filed Weights   12/29/23 1944  Weight: 105.7 kg    Examination:  General exam: Overall comfortable, not in distress, morbidly obese HEENT: PERRL Respiratory system:  no wheezes or crackles  Cardiovascular system: S1 & S2 heard, RRR.  Gastrointestinal system: Abdomen is nondistended, soft and nontender. Central nervous system: Alert and oriented Extremities: No edema, no clubbing ,no cyanosis, significantly improved edema of left foot, warm right foot Skin: No rashes, no ulcers,no icterus     Data Reviewed: I have personally reviewed following labs and imaging studies  CBC: Recent Labs  Lab 12/29/23 2137 12/30/23 0840 12/31/23 0308 01/01/24 0301  WBC 9.8 10.7* 11.0* 10.5  NEUTROABS 6.1 7.0  --   --   HGB 13.6 12.1 13.2 12.9  HCT 39.9 35.8* 40.1 38.6  MCV 100.5* 101.7* 103.6* 102.4*  PLT 262 224 243 239   Basic Metabolic Panel: Recent Labs  Lab 12/29/23 2137 12/30/23 0840 12/31/23 0308 01/01/24 0301  NA 134* 135 138 138  K 3.4* 3.9 3.7 3.8  CL 100 100 101 104  CO2 21* 24 24 21*  GLUCOSE 109* 98 119* 164*  BUN 14 13 17 23   CREATININE 1.17* 1.25* 1.46* 1.47*  CALCIUM 9.2 8.6* 9.2 9.1     Recent Results (from the past 240 hours)  Culture, blood (Routine X 2) w Reflex to ID Panel     Status: None (Preliminary result)   Collection Time: 12/31/23  8:22 AM   Specimen: BLOOD  Result Value Ref Range Status   Specimen Description BLOOD BLOOD RIGHT ARM  Final   Special Requests   Final    BOTTLES DRAWN AEROBIC AND ANAEROBIC Blood Culture results may not be optimal due to an inadequate volume of blood received in culture bottles   Culture   Final     NO GROWTH < 24 HOURS Performed at [facility], [address] [place], [place] [address]    Report Status PENDING  Incomplete  Culture, blood (Routine X 2) w Reflex to ID Panel     Status: None (Preliminary result)   Collection Time: 12/31/23  8:23 AM   Specimen: BLOOD  Result Value Ref Range Status   Specimen Description BLOOD BLOOD RIGHT ARM  Final   Special Requests   Final    BOTTLES DRAWN AEROBIC AND ANAEROBIC Blood Culture results may not be optimal due to an inadequate volume of blood received in culture bottles   Culture   Final  NO GROWTH < 24 HOURS Performed at [facility], [address], [place], [place] [address]    Report Status PENDING  Incomplete     Radiology Studies: No results found.   Scheduled Meds:  aspirin  EC  81 mg Oral Daily   ferrous sulfate   325 mg Oral Daily   mometasone -formoterol   2 puff Inhalation BID   predniSONE   50 mg Oral Q breakfast   sodium chloride  flush  3 mL Intravenous Q12H   Continuous Infusions:  cefTRIAXone  (ROCEPHIN )  IV 2 g (01/01/24 0948)   vancomycin  1,250 mg (12/31/23 1648)     LOS: 2 days   Ivonne Mustache, MD [facility] P1/13/2025, 1:20 PM

## 2024-01-01 NOTE — Evaluation (Signed)
 Occupational Therapy Evaluation Patient Details Name: Elaine Martinez MRN: [id number] DOB: 1961/12/09 Today's Date: 01/01/2024   History of Present Illness Elaine Martinez is a 63 y.o. female who presented with L foot pain. MRI showed early signs of osteomyelitis, no surgical plans. PMHx: lupus, hypertension, obesity, tobacco use   Clinical Impression   Elaine Martinez was evaluated s/p the above admission list. She is indep and cares for her 2 grandchildren and disabled sister at baseline, pt works full time as a LAWYER. Upon evaluation the pt was limited by L foot pain and limited activity tolerance due to pain. Overall she was mod I for bed mobility and CGA for transfers and ambulation with a RW, pt limited to ~58ft due to significant L foot pain. Due to the deficits listed below the pt also needs up to min A for LB ADLs and set up A for UB ADLs in sitting. Pt will benefit from continued acute OT services and HHOT.        If plan is discharge home, recommend the following: A little help with walking and/or transfers;A little help with bathing/dressing/bathroom;Assistance with cooking/housework;Direct supervision/assist for financial management;Direct supervision/assist for medications management;Assist for transportation;Help with stairs or ramp for entrance    Functional Status Assessment  Patient has had a recent decline in their functional status and demonstrates the ability to make significant improvements in function in a reasonable and predictable amount of time.  Equipment Recommendations  Other (comment);BSC/3in1 (RW)       Precautions / Restrictions Precautions Precautions: Fall Required Braces or Orthoses: Other Brace Other Brace: post-op shoe Restrictions Weight Bearing Restrictions Per Provider Order: Yes LLE Weight Bearing Per Provider Order: Weight bearing as tolerated      Mobility Bed Mobility Overal bed mobility: Modified Independent                  Transfers Overall  transfer level: Needs assistance Equipment used: Rolling walker (2 wheels) Transfers: Sit to/from Stand Sit to Stand: Supervision                  Balance Overall balance assessment: Mild deficits observed, not formally tested                 ADL either performed or assessed with clinical judgement   ADL Overall ADL's : Needs assistance/impaired Eating/Feeding: Independent   Grooming: Set up;Sitting   Upper Body Bathing: Set up;Sitting   Lower Body Bathing: Minimal assistance;Sit to/from stand   Upper Body Dressing : Set up;Sitting   Lower Body Dressing: Minimal assistance;Sit to/from stand Lower Body Dressing Details (indicate cue type and reason): max A for post-op shoe Toilet Transfer: Contact guard assist;Ambulation;Rolling walker (2 wheels) Toilet Transfer Details (indicate cue type and reason): limited distance due to L foot pain Toileting- Clothing Manipulation and Hygiene: Supervision/safety;Sitting/lateral lean       Functional mobility during ADLs: Contact guard assist;Rolling walker (2 wheels) General ADL Comments: limited by L foot pain with WBing     Vision Baseline Vision/History: 0 No visual deficits Vision Assessment?: No apparent visual deficits     Perception Perception: Within Functional Limits       Praxis Praxis: WFL       Pertinent Vitals/Pain Pain Assessment Pain Assessment: Faces Faces Pain Scale: Hurts whole lot Pain Location: L foot Pain Descriptors / Indicators: Grimacing, Crying Pain Intervention(s): Limited activity within patient's tolerance, Monitored during session     Extremity/Trunk Assessment Upper Extremity Assessment Upper Extremity Assessment: Overall WFL for tasks  assessed;Right hand dominant   Lower Extremity Assessment Lower Extremity Assessment: Defer to PT evaluation   Cervical / Trunk Assessment Cervical / Trunk Assessment: Normal   Communication Communication Communication: No apparent  difficulties   Cognition Arousal: Alert Behavior During Therapy: WFL for tasks assessed/performed Overall Cognitive Status: Within Functional Limits for tasks assessed           General Comments: anxious about getting home to care for her family     General Comments  VSS, limited by pain            Home Living Family/patient expects to be discharged to:: Private residence Living Arrangements: Other relatives (grandkids (14 & 46) and disabled sister) Available Help at Discharge: Family;Available PRN/intermittently Type of Home: House Home Access: Stairs to enter [employer] of Steps: 4 Entrance Stairs-Rails: None Home Layout: Two level;Bed/bath upstairs Alternate Level Stairs-Number of Steps: 15 Alternate Level Stairs-Rails: Right Bathroom Shower/Tub: Chief Strategy Officer: Standard Bathroom Accessibility: Yes   Home Equipment: None          Prior Functioning/Environment Prior Level of Function : Independent/Modified Independent             Mobility Comments: no AD, indep 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[employer], 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)        OT Problem List: Decreased strength;Decreased range of motion;Decreased activity tolerance      OT Treatment/Interventions: Self-care/ADL training;Therapeutic exercise;DME and/or AE instruction;Therapeutic activities;Patient/family education;Balance training    OT Goals(Current goals can be found in the care plan section) Acute Rehab OT Goals Patient Stated Goal: less pain OT Goal Formulation: With patient Time For Goal Achievement: 01/15/24 Potential to Achieve Goals: Good ADL Goals Pt Will Perform Grooming: with modified independence;standing Pt Will Perform Lower Body Dressing: with modified independence;sit to/from stand Pt Will Transfer to Toilet: with modified independence;ambulating  OT Frequency: Min 1X/week       AM-PAC OT 6 Clicks Daily Activity     Outcome Measure  Help from another person eating meals?: None Help from another person taking care of personal grooming?: A Little Help from another person toileting, which includes using toliet, bedpan, or urinal?: A Little Help from another person bathing (including washing, rinsing, drying)?: A Little Help from another person to put on and taking off regular upper body clothing?: A Little Help from another person to put on and taking off regular lower body clothing?: A Little 6 Click Score: 19   End of Session Equipment Utilized During Treatment: Gait belt;Rolling walker (2 wheels) Nurse Communication: Mobility status  Activity Tolerance: Patient limited by pain Patient left: in bed;with call bell/phone within reach  OT Visit Diagnosis: Unsteadiness on feet (R26.81);Other abnormalities of gait and mobility (R26.89)                Time: 1000-1016 OT Time Calculation (min): 16 min Charges:  OT General Charges $OT Visit: 1 Visit OT Evaluation $OT Eval Moderate Complexity: 1 Mod  Elaine Martinez, OTR/L Acute Rehabilitation Services Office [phone] Secure Chat Communication Preferred   Elaine Martinez 01/01/2024, 10:45 AM

## 2024-01-01 NOTE — Telephone Encounter (Signed)
 Pt called back after receiving a call and was scheduled for hospital post op on 1/27 with Dr Gershon.   She is asking about if she should be out of work until her appt on 1/27. She would need a note. She has never been seen in the office. Please advise about note.She has short term disability she said.

## 2024-01-01 NOTE — Evaluation (Signed)
 Physical Therapy Evaluation Patient Details Name: Elaine Martinez MRN: [id number] DOB: Jul 26, 1961 Today's Date: 01/01/2024  History of Present Illness  Elaine Martinez is a 63 y.o. female who presented with L foot pain. MRI showed early signs of osteomyelitis, no surgical plans. PMHx: lupus, hypertension, obesity, tobacco use  Clinical Impression  Pt presents to PT with deficits in activity tolerance, gait, balance. Pt reports improvement in activity tolerance compared to this morning, however the pt is still limited to ambulation of household distances. Pt attempts ambulation without UE support but is unsteady, benefiting from RW to offload LLE during weightbearing. PT will follow for further gait and stair training.      If plan is discharge home, recommend the following: A little help with bathing/dressing/bathroom;Assistance with cooking/housework;Help with stairs or ramp for entrance   Can travel by private vehicle        Equipment Recommendations Rolling walker (2 wheels)  Recommendations for Other Services       Functional Status Assessment Patient has had a recent decline in their functional status and demonstrates the ability to make significant improvements in function in a reasonable and predictable amount of time.     Precautions / Restrictions Precautions Precautions: Fall Required Braces or Orthoses: Other Brace Other Brace: post-op shoe Restrictions Weight Bearing Restrictions Per Provider Order: Yes LLE Weight Bearing Per Provider Order: Weight bearing as tolerated      Mobility  Bed Mobility Overal bed mobility: Independent                  Transfers Overall transfer level: Modified independent Equipment used: Rolling walker (2 wheels) Transfers: Sit to/from Stand Sit to Stand: Modified independent (Device/Increase time)                Ambulation/Gait Ambulation/Gait assistance: Supervision, Contact guard assist Gait Distance (Feet): 80  Feet Assistive device: Rolling walker (2 wheels) Gait Pattern/deviations: Step-through pattern, Decreased stance time - left Gait velocity: reduced Gait velocity interpretation: <1.8 ft/sec, indicate of risk for recurrent falls   General Gait Details: pt with slowed step-through gait with support of RW, reduced stance time on LLE. Pt attempts ambulation without DME with high guard and signifiant reduction in step-length, short shuffling steps with increased lateral trunk sway  Stairs            Wheelchair Mobility     Tilt Bed    Modified Rankin (Stroke Patients Only)       Balance Overall balance assessment: Needs assistance Sitting-balance support: Feet supported, No upper extremity supported Sitting balance-Leahy Scale: Good     Standing balance support: No upper extremity supported Standing balance-Leahy Scale: Fair                               Pertinent Vitals/Pain Pain Assessment Pain Assessment: Faces Faces Pain Scale: Hurts even more Pain Location: R foot (pt reporting more aching in R foot after ambulation than L foot) Pain Descriptors / Indicators: Aching Pain Intervention(s): Monitored during session    Home Living Family/patient expects to be discharged to:: Private residence Living Arrangements: Other relatives (grandkids (14 & 72) and disabled sister) Available Help at Discharge: Family;Available PRN/intermittently Type of Home: House Home Access: Stairs to enter Entrance Stairs-Rails: None Entrance Stairs-Number of Steps: 4 Alternate Level Stairs-Number of Steps: 15 Home Layout: Two level;Bed/bath upstairs Home Equipment: None      Prior Function Prior Level of Function : Independent/Modified Independent  Mobility Comments: no AD, indep 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[employer], 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)     Extremity/Trunk Assessment   Upper Extremity Assessment Upper Extremity Assessment: Overall WFL  for tasks assessed    Lower Extremity Assessment Lower Extremity Assessment: Overall WFL for tasks assessed    Cervical / Trunk Assessment Cervical / Trunk Assessment: Normal  Communication   Communication Communication: No apparent difficulties Cueing Techniques: Verbal cues  Cognition Arousal: Alert Behavior During Therapy: WFL for tasks assessed/performed Overall Cognitive Status: Within Functional Limits for tasks assessed                                          General Comments General comments (skin integrity, edema, etc.): VSS on RA    Exercises     Assessment/Plan    PT Assessment Patient needs continued PT services  PT Problem List Decreased activity tolerance;Decreased balance;Decreased mobility;Pain       PT Treatment Interventions DME instruction;Gait training;Stair training;Therapeutic activities;Functional mobility training;Balance training;Neuromuscular re-education;Patient/family education    PT Goals (Current goals can be found in the Care Plan section)  Acute Rehab PT Goals Patient Stated Goal: to return to independence PT Goal Formulation: With patient Time For Goal Achievement: 01/15/24 Potential to Achieve Goals: Good    Frequency Min 1X/week     Co-evaluation               AM-PAC PT 6 Clicks Mobility  Outcome Measure Help needed turning from your back to your side while in a flat bed without using bedrails?: None Help needed moving from lying on your back to sitting on the side of a flat bed without using bedrails?: None Help needed moving to and from a bed to a chair (including a wheelchair)?: A Little Help needed standing up from a chair using your arms (e.g., wheelchair or bedside chair)?: A Little Help needed to walk in hospital room?: A Little Help needed climbing 3-5 steps with a railing? : Total 6 Click Score: 18    End of Session   Activity Tolerance: Patient tolerated treatment well (improved tolerance  compared to OT eval, however still limited to household level mobility) Patient left: in bed;with call bell/phone within reach Nurse Communication: Mobility status PT Visit Diagnosis: Other abnormalities of gait and mobility (R26.89);Pain Pain - Right/Left: Left Pain - part of body: Ankle and joints of foot    Time: 8346-8281 PT Time Calculation (min) (ACUTE ONLY): 25 min   Charges:   PT Evaluation $PT Eval Low Complexity: 1 Low   PT General Charges $$ ACUTE PT VISIT: 1 Visit         Bernardino JINNY Ruth, PT, DPT Acute Rehabilitation Office [phone]   Bernardino JINNY Ruth 01/01/2024, 5:23 PM

## 2024-01-02 DIAGNOSIS — M86172 Other acute osteomyelitis, left ankle and foot: Secondary | ICD-10-CM | POA: Diagnosis not present

## 2024-01-02 MED ORDER — PREDNISONE 10 MG PO TABS: 10.0000 mg | ORAL_TABLET | Freq: Every day | ORAL | Status: DC

## 2024-01-02 MED ORDER — PREDNISONE 10 MG PO TABS
20.0000 mg | ORAL_TABLET | Freq: Every day | ORAL | Status: DC
  Administered 2024-01-03: 20 mg via ORAL
  Filled 2024-01-02: qty 2

## 2024-01-02 MED ORDER — CEPHALEXIN 500 MG PO CAPS
500.0000 mg | ORAL_CAPSULE | Freq: Three times a day (TID) | ORAL | Status: DC
Start: 2024-01-03 — End: 2024-01-08
  Administered 2024-01-03: 500 mg via ORAL
  Filled 2024-01-02: qty 1

## 2024-01-02 MED ORDER — CEPHALEXIN 500 MG PO CAPS: 500.0000 mg | ORAL_CAPSULE | Freq: Three times a day (TID) | ORAL | Status: DC

## 2024-01-02 MED ORDER — DOXYCYCLINE HYCLATE 100 MG PO TABS
100.0000 mg | ORAL_TABLET | Freq: Two times a day (BID) | ORAL | Status: DC
  Administered 2024-01-02 – 2024-01-03 (×3): 100 mg via ORAL
  Filled 2024-01-02 (×3): qty 1

## 2024-01-02 MED ORDER — PREDNISONE 10 MG PO TABS: 20.0000 mg | ORAL_TABLET | Freq: Every day | ORAL | Status: DC

## 2024-01-02 NOTE — Plan of Care (Signed)
 Patient alert/oriented X4. Patient compliant with medication administration and oxycodone  administered as needed for pain in L foot. Patient ambulated down halls using a walker and did not complain of SOB with exertion. VSS and tolerated IV antibiotics. No complaints at this time.   Problem: Education: Goal: Knowledge of General Education information will improve Description: Including pain rating scale, medication(s)/side effects and non-pharmacologic comfort measures Outcome: Progressing   Problem: Health Behavior/Discharge Planning: Goal: Ability to manage health-related needs will improve Outcome: Progressing   Problem: Clinical Measurements: Goal: Ability to maintain clinical measurements within normal limits will improve Outcome: Progressing   Problem: Clinical Measurements: Goal: Will remain free from infection Outcome: Progressing   Problem: Clinical Measurements: Goal: Diagnostic test results will improve Outcome: Progressing   Problem: Clinical Measurements: Goal: Respiratory complications will improve Outcome: Progressing   Problem: Clinical Measurements: Goal: Cardiovascular complication will be avoided Outcome: Progressing   Problem: Activity: Goal: Risk for activity intolerance will decrease Outcome: Progressing   Problem: Nutrition: Goal: Adequate nutrition will be maintained Outcome: Progressing   Problem: Coping: Goal: Level of anxiety will decrease Outcome: Progressing   Problem: Elimination: Goal: Will not experience complications related to bowel motility Outcome: Progressing   Problem: Elimination: Goal: Will not experience complications related to urinary retention Outcome: Progressing   Problem: Pain Management: Goal: General experience of comfort will improve Outcome: Progressing   Problem: Safety: Goal: Ability to remain free from injury will improve Outcome: Progressing   Problem: Skin Integrity: Goal: Risk for impaired skin  integrity will decrease Outcome: Progressing

## 2024-01-02 NOTE — Plan of Care (Signed)

## 2024-01-02 NOTE — Progress Notes (Signed)
 PROGRESS NOTE  Elaine Martinez  FMW:[id number] DOB: 1961-04-07 DOA: 12/29/2023 PCP: [facility]   Brief Narrative: Patient is a 63 year old female with history of lupus, hypertension, obesity, tobacco use who presented with left foot pain, unable to bear weight.  On presentation, she was hemodynamically stable.  Lab work showed elevated lactate level, elevated CRP, ESR.  X-ray, CT imaging / MRI reviewed along with podiatry, no suspicion for osteomyelitis.Started on broad-spectrum antibiotics for left foot cellulitis.   After hospitalization, she also developed right foot pain and swelling.  Uric acid  also elevated, started on prednisone  . Hospital course remarkable for constant bilateral foot pain, difficulty in ambulation.  Does not feel ready to go home today.  Assessment & Plan:  Principal Problem:   Acute osteomyelitis of left foot (HCC) Active Problems:   Cellulitis of great toe of left foot   Cellulitis  of left foot: Presented with left foot pain, edema, unable to bear weight.  No history of trauma but she walks barefoot at home and has wooden stairs  in poor condition.  Lab work showed elevated lactate level, elevated CRP, ESR.  Imagings with  no convincing evidence of osteomyelitis of the great toe or forefoot. Showed oft tissue swelling and edema of the dorsal foot. No organized or rim enhancing fluid collections. Started on broad-spectrum antibiotics.  Podiatry consulted. Cx negative Podiatry recommended conservative management with antibiotics, no plan for surgical intervention, no concern for foreign body in the foot. Left foot swelling and edema has significantly improved with antibiotics.  Will change antibiotics to oral for 5 more days  Gout: Right foot also appeared swollen on 1/12, suspicion for gout.  Uric acid elevated .CRP elevated .Started on prednisone  on tapering style.  Colchicine  not used because of CKD  CKD stage IIIa: Currently kidney function at  baseline.  Chronic alcohol abuse:   Continue thiamine folic acid.  Not in withdrawal  Hypertension: BP soft.\/Stable with current medication home isinopril- Hydrochlorothiazide  on hold  Tobacco use: Continue nicotine  patch.  Asthma/COPD: Continue bronchodilators as needed.  Currently not on exacerbation  Obesity: BMI of 41.2        DVT prophylaxis:enoxaparin  (LOVENOX ) injection 40 mg Start: 01/01/24 1415Lovenox     Code Status: Full Code  Family Communication: None at the bedside  Patient status:Inpatient  Patient is from :Home  Anticipated discharge un:Ynfz  Estimated DC date:tomorrow   Consultants: Podiatry  Procedures:None  Antimicrobials:  Anti-infectives (From admission, onward)    Start     Dose/Rate Route Frequency Ordered Stop   01/03/24 0600  cephALEXin  (KEFLEX ) capsule 500 mg        500 mg Oral Every 8 hours 01/02/24 1307 01/08/24 0559   01/02/24 1400  cephALEXin  (KEFLEX ) capsule 500 mg  Status:  Discontinued        500 mg Oral Every 8 hours 01/02/24 1306 01/02/24 1307   01/02/24 1400  doxycycline  (VIBRA -TABS) tablet 100 mg        100 mg Oral Every 12 hours 01/02/24 1306 01/07/24 0959   12/31/23 1800  vancomycin  (VANCOREADY) IVPB 1250 mg/250 mL  Status:  Discontinued        1,250 mg 166.7 mL/hr over 90 Minutes Intravenous Every 36 hours 12/31/23 0946 01/02/24 1306   12/31/23 1000  cefTRIAXone  (ROCEPHIN ) 2 g in sodium chloride  0.9 % 100 mL IVPB  Status:  Discontinued        2 g 200 mL/hr over 30 Minutes Intravenous Daily 12/31/23 0802 01/02/24 1306  12/31/23 0800  vancomycin  (VANCOCIN ) IVPB 1000 mg/200 mL premix  Status:  Discontinued        1,000 mg 200 mL/hr over 60 Minutes Intravenous Every 24 hours 12/30/23 1115 12/31/23 0946   12/30/23 1400  piperacillin -tazobactam (ZOSYN ) IVPB 3.375 g  Status:  Discontinued        3.375 g 12.5 mL/hr over 240 Minutes Intravenous Every 8 hours 12/30/23 1010 12/30/23 1011   12/30/23 1015  piperacillin -tazobactam  (ZOSYN ) IVPB 3.375 g  Status:  Discontinued        3.375 g 12.5 mL/hr over 240 Minutes Intravenous Every 8 hours 12/30/23 1011 12/31/23 0802   12/30/23 0615  cefTRIAXone  (ROCEPHIN ) 2 g in sodium chloride  0.9 % 100 mL IVPB        2 g 200 mL/hr over 30 Minutes Intravenous  Once 12/30/23 0609 12/30/23 0646   12/30/23 0615  vancomycin  (VANCOREADY) IVPB 2000 mg/400 mL        2,000 mg 200 mL/hr over 120 Minutes Intravenous  Once 12/30/23 0612 12/30/23 1206       Subjective: Patient seen and examined at the bedside today.  Left foot appears significantly better with almost no edema or tenderness or erythema.  She has mild edema of the right foot.  Long discussion held at the bedside about no evidence of osteomyelitis.  She again wanted to talk to podiatry so I requested Dr. Malvin to evaluate her today.  Podiatry has cleared for discharge but she does not feel ready to go home today saying that she cannot walk and says she the caretaker of her family at home.  PT recommended no follow-up.  We discussed about  discharge plan tomorrow after OT evaluation.  Objective: Vitals:   01/01/24 1952 01/01/24 2011 01/02/24 0416 01/02/24 0757  BP:  133/84 124/80 117/70  Pulse:  100 95 81  Resp:   18 18  Temp:  97.6 F (36.4 C) 98 F (36.7 C) 97.9 F (36.6 C)  TempSrc:  Oral Oral Oral  SpO2: 96% 97% 98% 100%  Weight:      Height:       No intake or output data in the 24 hours ending 01/02/24 1308  Filed Weights   12/29/23 1944  Weight: 105.7 kg    Examination:  General exam: Overall comfortable, not in distress, morbidly obese HEENT: PERRL Respiratory system:  no wheezes or crackles  Cardiovascular system: S1 & S2 heard, RRR.  Gastrointestinal system: Abdomen is nondistended, soft and nontender. Central nervous system: Alert and oriented Extremities:  no clubbing ,no cyanosis, mild edema of right foot, edema of left foot resolved.  No erythema Skin: No rashes, no ulcers,no icterus      Data Reviewed: I have personally reviewed following labs and imaging studies  CBC: Recent Labs  Lab 12/29/23 2137 12/30/23 0840 12/31/23 0308 01/01/24 0301  WBC 9.8 10.7* 11.0* 10.5  NEUTROABS 6.1 7.0  --   --   HGB 13.6 12.1 13.2 12.9  HCT 39.9 35.8* 40.1 38.6  MCV 100.5* 101.7* 103.6* 102.4*  PLT 262 224 243 239   Basic Metabolic Panel: Recent Labs  Lab 12/29/23 2137 12/30/23 0840 12/31/23 0308 01/01/24 0301  NA 134* 135 138 138  K 3.4* 3.9 3.7 3.8  CL 100 100 101 104  CO2 21* 24 24 21*  GLUCOSE 109* 98 119* 164*  BUN 14 13 17 23   CREATININE 1.17* 1.25* 1.46* 1.47*  CALCIUM 9.2 8.6* 9.2 9.1     Recent Results (  from the past 240 hours)  Culture, blood (Routine X 2) w Reflex to ID Panel     Status: None (Preliminary result)   Collection Time: 12/31/23  8:22 AM   Specimen: BLOOD  Result Value Ref Range Status   Specimen Description BLOOD BLOOD RIGHT ARM  Final   Special Requests   Final    BOTTLES DRAWN AEROBIC AND ANAEROBIC Blood Culture results may not be optimal due to an inadequate volume of blood received in culture bottles   Culture   Final    NO GROWTH 2 DAYS Performed at [facility], [address], [place], [place] [address]    Report Status PENDING  Incomplete  Culture, blood (Routine X 2) w Reflex to ID Panel     Status: None (Preliminary result)   Collection Time: 12/31/23  8:23 AM   Specimen: BLOOD  Result Value Ref Range Status   Specimen Description BLOOD BLOOD RIGHT ARM  Final   Special Requests   Final    BOTTLES DRAWN AEROBIC AND ANAEROBIC Blood Culture results may not be optimal due to an inadequate volume of blood received in culture bottles   Culture   Final    NO GROWTH 2 DAYS Performed at [facility], [address], [place], [place] [address]    Report Status PENDING  Incomplete     Radiology Studies: No results found.   Scheduled Meds:  aspirin  EC  81 mg Oral Daily   [START ON 01/03/2024] cephALEXin   500 mg  Oral Q8H   doxycycline   100 mg Oral Q12H   enoxaparin  (LOVENOX ) injection  40 mg Subcutaneous Q24H   ferrous sulfate   325 mg Oral Daily   mometasone -formoterol   2 puff Inhalation BID   [START ON 01/06/2024] predniSONE   10 mg Oral Q breakfast   [START ON 01/03/2024] predniSONE   20 mg Oral Q breakfast   sodium chloride  flush  3 mL Intravenous Q12H   Continuous Infusions:     LOS: 3 days   Ivonne Mustache, MD [facility] P1/14/2025, 1:08 PM

## 2024-01-02 NOTE — Progress Notes (Signed)
 Physical Therapy Treatment Patient Details Name: Elaine Martinez MRN: [id number] DOB: 1961/01/21 Today's Date: 01/02/2024   History of Present Illness Elaine Martinez is a 63 y.o. female who presented with L foot pain. MRI showed early signs of osteomyelitis, no surgical plans. PMHx: lupus, hypertension, obesity, tobacco use    PT Comments  Continuing work on functional mobility and activity tolerance;  Session focused on gait and stair training, and pt showing good use of RW with amb; Took a lot of time on stair training as pt has a flight of steps to reach bedroom and bathroom; Able to ascend and descend stairs, but with extra time and pain, and standing rest breaks; more difficult than anticipated because of possible gout in R foot now; Anticiipate dc home tomorrow   If plan is discharge home, recommend the following: A little help with bathing/dressing/bathroom;Assistance with cooking/housework;Help with stairs or ramp for entrance   Can travel by private vehicle        Equipment Recommendations  Rolling walker (2 wheels);BSC/3in1    Recommendations for Other Services       Precautions / Restrictions Precautions Precautions: Fall Required Braces or Orthoses: Other Brace Other Brace: post-op shoe Restrictions LLE Weight Bearing Per Provider Order: Weight bearing as tolerated     Mobility  Bed Mobility Overal bed mobility: Independent                  Transfers Overall transfer level: Modified independent Equipment used: Rolling walker (2 wheels) Transfers: Sit to/from Stand Sit to Stand: Modified independent (Device/Increase time)                Ambulation/Gait Ambulation/Gait assistance: Supervision Gait Distance (Feet): 80 Feet Assistive device: Rolling walker (2 wheels)         General Gait Details: Good use of RW to offload painful feet   Stairs Stairs: Yes Stairs assistance: Contact guard assist, Min assist Stair Management: One rail Left, Step  to pattern, Forwards (semi-sideways) Number of Stairs: 12 General stair comments: demonstration of options for gettin up and down the stairs using L rail; cues for form and technqiue; noting a lot of effort, and difficulty because her R foot has been in pain, likely gout; took 3-4 standing rest breaks   Wheelchair Mobility     Tilt Bed    Modified Rankin (Stroke Patients Only)       Balance     Sitting balance-Leahy Scale: Good       Standing balance-Leahy Scale: Fair                              Cognition Arousal: Alert Behavior During Therapy: WFL for tasks assessed/performed Overall Cognitive Status: Within Functional Limits for tasks assessed                                 General Comments: anxious about getting home to care for her family        Exercises      General Comments General comments (skin integrity, edema, etc.): Pt very talkative, and at times tangential, as we discussed considerations for DC home      Pertinent Vitals/Pain Pain Assessment Pain Assessment: Faces Faces Pain Scale: Hurts a little bit Pain Location: R foot (pt reporting more aching in R foot after ambulation than L foot) Pain Descriptors / Indicators: Aching Pain Intervention(s): Premedicated before  session    Home Living                          Prior Function            PT Goals (current goals can now be found in the care plan section) Acute Rehab PT Goals Patient Stated Goal: to return to independence PT Goal Formulation: With patient Time For Goal Achievement: 01/15/24 Potential to Achieve Goals: Good Progress towards PT goals: Progressing toward goals    Frequency    Min 1X/week      PT Plan      Co-evaluation              AM-PAC PT 6 Clicks Mobility   Outcome Measure  Help needed turning from your back to your side while in a flat bed without using bedrails?: None Help needed moving from lying on your back  to sitting on the side of a flat bed without using bedrails?: None Help needed moving to and from a bed to a chair (including a wheelchair)?: A Little Help needed standing up from a chair using your arms (e.g., wheelchair or bedside chair)?: A Little Help needed to walk in hospital room?: A Little Help needed climbing 3-5 steps with a railing? : A Lot 6 Click Score: 19    End of Session Equipment Utilized During Treatment: Gait belt Activity Tolerance: Patient tolerated treatment well (improved tolerance compared to OT eval, however still limited to household level mobility) Patient left: in bed;with call bell/phone within reach;Other (comment) (Podiatrist) Nurse Communication: Mobility status Pain - Right/Left:  (Bilateral) Pain - part of body: Ankle and joints of foot     Time: 8955-8862 PT Time Calculation (min) (ACUTE ONLY): 53 min  Charges:    $Gait Training: 38-52 mins $Therapeutic Activity: 8-22 mins PT General Charges $$ ACUTE PT VISIT: 1 Visit                     Silvano Currier, PT  Acute Rehabilitation Services Office [phone] Secure Chat welcomed    Silvano VEAR Currier 01/02/2024, 2:47 PM

## 2024-01-02 NOTE — TOC Initial Note (Addendum)
 Transition of Care [facility] - Initial/Assessment Note    Patient Details  Name: Elaine Martinez MRN: [id number] Date of Birth: [birth year]  Transition of Care [facility] CM/SW Contact:    Marval Gell, RN Phone Number: 01/02/2024, 10:02 AM  Clinical Narrative:                  Spoke w aptient at bedside.  Discussed DME needs.  RW and BSC ordered through [employer] to be delivered to the room   PT states they will recommend [facility] today.  Coverage may be difficult with her commercial [employer] source.  Medi HH- Pending   Barriers to Discharge: Continued Medical Work up   Patient Goals and CMS Choice            Expected Discharge Plan and Services                         DME Arranged: Bedside commode, Walker rolling DME Agency: [facility] Date DME Agency Contacted: 01/02/24 Time DME Agency Contacted: 1001 Representative spoke with at DME Agency: Ryan            Prior Living Arrangements/Services                       Activities of Daily Living   ADL Screening (condition at time of admission) Independently performs ADLs?: Yes (appropriate for developmental age) Is the patient deaf or have difficulty hearing?: No Does the patient have difficulty seeing, even when wearing glasses/contacts?: No Does the patient have difficulty concentrating, remembering, or making decisions?: No  Permission Sought/Granted                  Emotional Assessment              Admission diagnosis:  Acute osteomyelitis of left foot (HCC) [M86.172] Osteomyelitis of left foot, unspecified type (HCC) [M86.9] Patient Active Problem List   Diagnosis Date Noted   Cellulitis of great toe of left foot 12/31/2023   Acute osteomyelitis of left foot (HCC) 12/30/2023   Acute bronchitis 09/15/2023   HTN (hypertension) 09/15/2023   Obesity (BMI 30-39.9) 09/15/2023   Hypomagnesemia 09/15/2023   Abdominal pain 10/21/2020   Diarrhea 10/21/2020   AKI (acute kidney injury) (HCC)  10/21/2020   Metabolic acidosis 10/21/2020   SIRS (systemic inflammatory response syndrome) (HCC) 10/21/2020   PCP:  [facility] Pharm[facility]:   [id number] - [place], [place] - [address] [place] [address] Phone: [phone] Fax: [phone]     Social Drivers of Health (SDOH) Social History: SDOH Screenings   Food Insecurity: No Food Insecurity (12/30/2023)  Housing: High Risk (12/30/2023)  Transportation Needs: No Transportation Needs (12/30/2023)  Utilities: Not At Risk (12/30/2023)  Tobacco Use: High Risk (12/29/2023)   SDOH Interventions:     Readmission Risk Interventions     No data to display

## 2024-01-02 NOTE — Plan of Care (Signed)
  Problem: Education: Goal: Knowledge of General Education information will improve Description: Including pain rating scale, medication(s)/side effects and non-pharmacologic comfort measures Outcome: Progressing   Problem: Clinical Measurements: Goal: Ability to maintain clinical measurements within normal limits will improve Outcome: Progressing Goal: Will remain free from infection Outcome: Progressing Goal: Diagnostic test results will improve Outcome: Progressing Goal: Respiratory complications will improve Outcome: Progressing Goal: Cardiovascular complication will be avoided Outcome: Progressing   Problem: Activity: Goal: Risk for activity intolerance will decrease Outcome: Progressing   Problem: Nutrition: Goal: Adequate nutrition will be maintained Outcome: Progressing   Problem: Elimination: Goal: Will not experience complications related to bowel motility Outcome: Progressing Goal: Will not experience complications related to urinary retention Outcome: Progressing   Problem: Pain Management: Goal: General experience of comfort will improve Outcome: Progressing   Problem: Safety: Goal: Ability to remain free from injury will improve Outcome: Progressing   Problem: Skin Integrity: Goal: Risk for impaired skin integrity will decrease Outcome: Progressing

## 2024-01-03 ENCOUNTER — Encounter: Payer: Self-pay | Admitting: Podiatry

## 2024-01-03 ENCOUNTER — Other Ambulatory Visit (HOSPITAL_COMMUNITY): Payer: Self-pay

## 2024-01-03 DIAGNOSIS — M86172 Other acute osteomyelitis, left ankle and foot: Secondary | ICD-10-CM | POA: Diagnosis not present

## 2024-01-03 MED ORDER — PREDNISONE 10 MG PO TABS
10.0000 mg | ORAL_TABLET | Freq: Every day | ORAL | 0 refills | Status: AC
  Filled 2024-01-03: qty 3, 3d supply, fill #0

## 2024-01-03 MED ORDER — PREDNISONE 20 MG PO TABS
20.0000 mg | ORAL_TABLET | Freq: Every day | ORAL | 0 refills | Status: AC
  Filled 2024-01-03: qty 2, 2d supply, fill #0

## 2024-01-03 MED ORDER — DOXYCYCLINE HYCLATE 100 MG PO TABS
100.0000 mg | ORAL_TABLET | Freq: Two times a day (BID) | ORAL | 0 refills | Status: AC
  Filled 2024-01-03: qty 8, 4d supply, fill #0

## 2024-01-03 MED ORDER — CEPHALEXIN 500 MG PO CAPS
500.0000 mg | ORAL_CAPSULE | Freq: Three times a day (TID) | ORAL | 0 refills | Status: AC
  Filled 2024-01-03: qty 12, 4d supply, fill #0

## 2024-01-03 MED ORDER — MOMETASONE FURO-FORMOTEROL FUM 200-5 MCG/ACT IN AERO
2.0000 | INHALATION_SPRAY | Freq: Two times a day (BID) | RESPIRATORY_TRACT | Status: DC
  Filled 2024-01-03: qty 8.8

## 2024-01-03 MED ORDER — OXYCODONE HCL 5 MG PO TABS
5.0000 mg | ORAL_TABLET | Freq: Four times a day (QID) | ORAL | 0 refills | Status: DC | PRN
  Filled 2024-01-03: qty 10, 3d supply, fill #0

## 2024-01-03 NOTE — Telephone Encounter (Signed)
 Called pt and left message that the letter is ready and I just need to know how she would like me to get it to her.

## 2024-01-03 NOTE — Discharge Summary (Signed)
 Physician Discharge Summary  Elaine Martinez ZOX:[id number] DOB: [birth year] DOA: 12/29/2023  PCP: [facility]  Admit date: 12/29/2023 Discharge date: 01/03/2024  Admitted From: Home Disposition:  Home  Discharge Condition:Stable CODE STATUS:FULL Diet recommendation: Heart Healthy  Brief/Interim Summary: Patient is a 63 year old female with history of lupus, hypertension, obesity, tobacco use who presented with left foot pain, unable to bear weight.  On presentation, she was hemodynamically stable.  Lab work showed elevated lactate level, elevated CRP, ESR.  X-ray, CT imaging / MRI reviewed along with podiatry, no suspicion for osteomyelitis.Started on broad-spectrum antibiotics for left foot cellulitis.   After hospitalization, she also developed right foot pain and swelling.  Uric acid  also elevated, started on prednisone  .  Podiatry was following.  Currently left foot cellulitis is significantly improved.  PT recommend home health.  She will follow-up with podiatry in 1/27  Following problems were addressed during the hospitalization:  Cellulitis  of left foot: Presented with left foot pain, edema, unable to bear weight.  No history of trauma but she walks barefoot at home and has wooden stairs  in poor condition.  Lab work showed elevated lactate level, elevated CRP, ESR.  Imagings with  no convincing evidence of osteomyelitis of the great toe or forefoot. Showed oft tissue swelling and edema of the dorsal foot. No organized or rim enhancing fluid collections. Started on broad-spectrum antibiotics.  Podiatry consulted. Cx negative Podiatry recommended conservative management with antibiotics, no plan for surgical intervention, no concern for foreign body in the foot. Left foot swelling and edema has significantly improved with antibiotics.  Will change antibiotics to oral for 4 more days.She will follow-up with podiatry in 1/27   Gout: Right foot also appeared swollen on 1/12,  suspicion for gout.  Uric acid elevated .CRP elevated .Started on prednisone  on tapering style.  Colchicine  not used because of CKD   CKD stage IIIa: Currently kidney function at baseline.   Chronic alcohol abuse:   Not in withdrawal   Hypertension: BP Stable  now.Can resume home med   Tobacco use: Counseled for cessation   Asthma/COPD: Continue bronchodilators as needed.  Currently not on exacerbation   Obesity: BMI of 41.2   Discharge Diagnoses:  Principal Problem:   Acute osteomyelitis of left foot (HCC) Active Problems:   Cellulitis of great toe of left foot    Discharge Instructions  Discharge Instructions     Diet - low sodium heart healthy   Complete by: As directed    Discharge instructions   Complete by: As directed    1)Please take prescribed medications as instructed 2)Follow up with podiatry on 1/27.   Increase activity slowly   Complete by: As directed       Allergies as of 01/03/2024   No Known Allergies      Medication List     TAKE these medications    albuterol  108 (90 Base) MCG/ACT inhaler Commonly known as: VENTOLIN  HFA Inhale 2 puffs into the lungs every 6 (six) hours as needed for wheezing or shortness of breath.   aspirin  EC 81 MG tablet Take 81 mg by mouth daily.   Biotin 10 MG Caps Take 10 mg by mouth once a week.   cephALEXin  500 MG capsule Commonly known as: KEFLEX  Take 1 capsule (500 mg total) by mouth every 8 (eight) hours for 4 days.   clobetasol  ointment 0.05 % Commonly known as: TEMOVATE  Apply 1 Application topically at bedtime as needed.   doxycycline  100  MG tablet Commonly known as: VIBRA -TABS Take 1 tablet (100 mg total) by mouth every 12 (twelve) hours for 4 days.   hydrOXYzine 25 MG tablet Commonly known as: ATARAX Take 25 mg by mouth at bedtime as needed for anxiety (Sleep).   IRON PO Take 1 tablet by mouth every 14 (fourteen) days.   ketoconazole 2 % cream Commonly known as: NIZORAL Apply 1 Application  topically daily as needed for irritation.   lisinopril -hydrochlorothiazide  20-25 MG tablet Commonly known as: ZESTORETIC  Take 1 tablet by mouth daily. What changed:  when to take this reasons to take this   mometasone -formoterol  200-5 MCG/ACT Aero Commonly known as: DULERA  Inhale 2 puffs into the lungs 2 (two) times daily. What changed:  when to take this reasons to take this   MULTIVITAMIN PO Take 1 tablet by mouth daily.   oxyCODONE  5 MG immediate release tablet Commonly known as: Oxy IR/ROXICODONE  Take 1 tablet (5 mg total) by mouth every 6 (six) hours as needed for moderate pain (pain score 4-6).   predniSONE  20 MG tablet Commonly known as: DELTASONE  Take 1 tablet (20 mg total) by mouth daily with breakfast for 2 days. Start taking on: January 04, 2024   predniSONE  10 MG tablet Commonly known as: DELTASONE  Take 1 tablet (10 mg total) by mouth daily with breakfast for 3 days. Start taking on: January 06, 2024   VITAMIN D PO Take 1 tablet by mouth every other day.   VITAMIN E PO Take 1 capsule by mouth daily.               Durable Medical Equipment  (From admission, onward)           Start     Ordered   01/02/24 1001  For home use only DME Walker rolling  Once       Question Answer Comment  Walker: With 5 Inch Wheels   Patient needs a walker to treat with the following condition Weakness      01/02/24 1000   01/02/24 1001  For home use only DME Bedside commode  Once       Question:  Patient needs a bedside commode to treat with the following condition  Answer:  Weakness   01/02/24 1000            Follow-up Information     [facility]. Schedule an appointment as soon as possible for a visit in 1 week(s).   Contact information: [address] [place] [address] [phone]                No Known Allergies  Consultations: Podiatry   Procedures/Studies: MR FOOT LEFT W WO CONTRAST Result Date:  12/30/2023 CLINICAL DATA:  Left foot pain EXAM: MRI OF THE LEFT FOREFOOT WITHOUT AND WITH CONTRAST TECHNIQUE: Multiplanar, multisequence MR imaging of the left forefoot was performed both before and after administration of intravenous contrast. CONTRAST:  10mL GADAVIST  GADOBUTROL  1 MMOL/ML IV SOLN COMPARISON:  X-ray and CT from 12/29/2023 FINDINGS: Bones/Joint/Cartilage Small foreign body within the plantar-lateral soft tissues of the great toe at the level of the IP joint with resultant susceptibility artifact. This creates alterations in the degree of fat saturation in this area. There is no convincing evidence of bone marrow edema of the great toe. No marrow replacement. Chronic appearing deformity of the distal phalanx of the great toe. Bipartite tibial hallux sesamoid. Mild-to-moderate degenerative changes are most pronounced at the TMT joint level where  there is mild reactive subchondral bone marrow edema. Small joint effusions of the first through fourth MTP joints, nonspecific. No fracture or dislocation. Ligaments Intact Lisfranc ligament.  Intact collateral ligaments. Muscles and Tendons No acute musculotendinous abnormality by CT.  No tenosynovitis. Soft tissues Soft tissue swelling and edema of the dorsal foot. No organized or rim enhancing fluid collections. IMPRESSION: 1. Small foreign body within the plantar-lateral soft tissues of the great toe at the level of the IP joint. No convincing evidence of osteomyelitis of the great toe or forefoot. 2. Soft tissue swelling and edema of the dorsal foot. No organized or rim enhancing fluid collections. 3. Mild-to-moderate degenerative changes of the midfoot. Electronically Signed   By: Leverne Reading D.O.   On: 12/30/2023 10:47   CT FOOT LEFT W CONTRAST Result Date: 12/29/2023 CLINICAL DATA:  Osteomyelitis suspected, foot, xray done concern is nec fasc, has tenderness of mid foot where edema noted. EXAM: CT OF THE LOWER LEFT EXTREMITY WITH CONTRAST  TECHNIQUE: Multidetector CT imaging of the lower left extremity was performed according to the standard protocol following intravenous contrast administration. RADIATION DOSE REDUCTION: This exam was performed according to the departmental dose-optimization program which includes automated exposure control, adjustment of the mA and/or kV according to patient size and/or use of iterative reconstruction technique. CONTRAST:  75mL OMNIPAQUE  IOHEXOL  350 MG/ML SOLN COMPARISON:  X-ray left foot 12/29/2023 FINDINGS: Bones/Joint/Cartilage Vague cortical irregularity along the dorsal aspect of the first digit distal phalangeal tuft. No evidence of fracture, dislocation, or joint effusion. No evidence of severe arthropathy. No aggressive appearing focal bone abnormality. Soft tissues are unremarkable. Ligaments Suboptimally assessed by CT. Muscles and Tendons Grossly unremarkable. Soft tissues Dorsal midfoot subcutaneus soft tissue edema with no CT evidence of emphysema-likely artifactual on x-ray due to overlying soft tissue. No retained radiopaque foreign body. No organized fluid collection. Vascular: Grossly unremarkable. IMPRESSION: Vague cortical irregularity along the dorsal aspect of the first digit distal phalangeal tuft. Finding correlates with retrospectively similar finding on x-ray left foot 12/29/2023. If concern for osteomyelitis, consider MRI with and without contrast for further evaluation. Electronically Signed   By: Morgane  Naveau M.D.   On: 12/29/2023 23:01   DG Foot Complete Left Addendum Date: 12/29/2023 ADDENDUM REPORT: 12/29/2023 22:58 ADDENDUM: Vague cortical irregularity along the dorsal aspect of the first digit distal phalangeal tuft. Electronically Signed   By: Morgane  Naveau M.D.   On: 12/29/2023 22:58   Result Date: 12/29/2023 CLINICAL DATA:  Pain and swelling EXAM: LEFT FOOT - COMPLETE 3+ VIEW COMPARISON:  None Available. FINDINGS: There is no evidence of fracture or dislocation. There is  no evidence of severe arthropathy or other focal bone abnormality. 2 mm density overlies the plantar soft tissues of the distal first digit. Subcutaneus soft tissue edema of the dorsal midfoot with query emphysema. IMPRESSION: 1. A 2 mm density overlies the plantar soft tissues of the distal first digit. Query retained radiopaque foreign body. Correlate with physical exam. 2. Subcutaneus soft tissue edema of the dorsal midfoot with query emphysema. Electronically Signed: By: Morgane  Naveau M.D. On: 12/29/2023 20:15      Subjective:  Patient seen and examined at bedside today.  Hemodynamically stable for discharge.  Able to walk yesterday.  Left foot swelling significantly improved.  Discharge Exam: Vitals:   01/03/24 0430 01/03/24 0812  BP: (!) 134/91 (!) 140/81  Pulse: 84 79  Resp: 18 18  Temp: 98.1 F (36.7 C) 98 F (36.7 C)  SpO2: 100% 100%  Vitals:   01/02/24 1930 01/02/24 2029 01/03/24 0430 01/03/24 0812  BP:  137/84 (!) 134/91 (!) 140/81  Pulse: 75 90 84 79  Resp: 16 18 18 18   Temp:  (!) 97.4 F (36.3 C) 98.1 F (36.7 C) 98 F (36.7 C)  TempSrc:  Oral Oral   SpO2: 99% 100% 100% 100%  Weight:      Height:        General: Pt is alert, awake, not in acute distress,obese Cardiovascular: RRR, S1/S2 +, no rubs, no gallops Respiratory: CTA bilaterally, no wheezing, no rhonchi Abdominal: Soft, NT, ND, bowel sounds + Extremities: no edema, no cyanosis    The results of significant diagnostics from this hospitalization (including imaging, microbiology, ancillary and laboratory) are listed below for reference.     Microbiology: Recent Results (from the past 240 hours)  Culture, blood (Routine X 2) w Reflex to ID Panel     Status: None (Preliminary result)   Collection Time: 12/31/23  8:22 AM   Specimen: BLOOD  Result Value Ref Range Status   Specimen Description BLOOD BLOOD RIGHT ARM  Final   Special Requests   Final    BOTTLES DRAWN AEROBIC AND ANAEROBIC Blood  Culture results may not be optimal due to an inadequate volume of blood received in culture bottles   Culture   Final    NO GROWTH 3 DAYS Performed at [facility], [address], [place], [place] [address]    Report Status PENDING  Incomplete  Culture, blood (Routine X 2) w Reflex to ID Panel     Status: None (Preliminary result)   Collection Time: 12/31/23  8:23 AM   Specimen: BLOOD  Result Value Ref Range Status   Specimen Description BLOOD BLOOD RIGHT ARM  Final   Special Requests   Final    BOTTLES DRAWN AEROBIC AND ANAEROBIC Blood Culture results may not be optimal due to an inadequate volume of blood received in culture bottles   Culture   Final    NO GROWTH 3 DAYS Performed at [facility], [address] [place], [place] [address]    Report Status PENDING  Incomplete     Labs: BNP (last 3 results) Recent Labs    09/14/23 1930  BNP 26.2   Basic Metabolic Panel: Recent Labs  Lab 12/29/23 2137 12/30/23 0840 12/31/23 0308 01/01/24 0301  NA 134* 135 138 138  K 3.4* 3.9 3.7 3.8  CL 100 100 101 104  CO2 21* 24 24 21*  GLUCOSE 109* 98 119* 164*  BUN 14 13 17 23   CREATININE 1.17* 1.25* 1.46* 1.47*  CALCIUM 9.2 8.6* 9.2 9.1   Liver Function Tests: Recent Labs  Lab 12/29/23 2137 12/30/23 0840 12/31/23 0308  AST 32 26 29  ALT 22 19 19   ALKPHOS 99 91 102  BILITOT 0.4 0.9 0.5  PROT 7.0 5.7* 6.9  ALBUMIN 3.2* 2.7* 3.1*   No results for input(s): "LIPASE", "AMYLASE" in the last 168 hours. No results for input(s): "AMMONIA" in the last 168 hours. CBC: Recent Labs  Lab 12/29/23 2137 12/30/23 0840 12/31/23 0308 01/01/24 0301  WBC 9.8 10.7* 11.0* 10.5  NEUTROABS 6.1 7.0  --   --   HGB 13.6 12.1 13.2 12.9  HCT 39.9 35.8* 40.1 38.6  MCV 100.5* 101.7* 103.6* 102.4*  PLT 262 224 243 239   Cardiac Enzymes: No results for input(s): "CKTOTAL", "CKMB", "CKMBINDEX", "TROPONINI" in the last 168 hours. BNP: Invalid input(s): "POCBNP" CBG:  No  results for input(s): "GLUCAP" in the last 168 hours. D-Dimer No results for input(s): "DDIMER" in the last 72 hours. Hgb A1c No results for input(s): "HGBA1C" in the last 72 hours. Lipid Profile No results for input(s): "CHOL", "HDL", "LDLCALC", "TRIG", "CHOLHDL", "LDLDIRECT" in the last 72 hours. Thyroid  function studies No results for input(s): "TSH", "T4TOTAL", "T3FREE", "THYROIDAB" in the last 72 hours.  Invalid input(s): "FREET3" Anemia work up No results for input(s): "VITAMINB12", "FOLATE", "FERRITIN", "TIBC", "IRON", "RETICCTPCT" in the last 72 hours. Urinalysis    Component Value Date/Time   COLORURINE YELLOW 09/15/2023 1130   APPEARANCEUR CLEAR 09/15/2023 1130   LABSPEC 1.025 09/15/2023 1130   PHURINE 7.0 09/15/2023 1130   GLUCOSEU NEGATIVE 09/15/2023 1130   HGBUR SMALL (A) 09/15/2023 1130   BILIRUBINUR NEGATIVE 09/15/2023 1130   KETONESUR NEGATIVE 09/15/2023 1130   PROTEINUR NEGATIVE 09/15/2023 1130   NITRITE NEGATIVE 09/15/2023 1130   LEUKOCYTESUR NEGATIVE 09/15/2023 1130   Sepsis Labs Recent Labs  Lab 12/29/23 2137 12/30/23 0840 12/31/23 0308 01/01/24 0301  WBC 9.8 10.7* 11.0* 10.5   Microbiology Recent Results (from the past 240 hours)  Culture, blood (Routine X 2) w Reflex to ID Panel     Status: None (Preliminary result)   Collection Time: 12/31/23  8:22 AM   Specimen: BLOOD  Result Value Ref Range Status   Specimen Description BLOOD BLOOD RIGHT ARM  Final   Special Requests   Final    BOTTLES DRAWN AEROBIC AND ANAEROBIC Blood Culture results may not be optimal due to an inadequate volume of blood received in culture bottles   Culture   Final    NO GROWTH 3 DAYS Performed at [facility], [address] [place], [place] [address]    Report Status PENDING  Incomplete  Culture, blood (Routine X 2) w Reflex to ID Panel     Status: None (Preliminary result)   Collection Time: 12/31/23  8:23 AM   Specimen: BLOOD  Result Value Ref Range Status    Specimen Description BLOOD BLOOD RIGHT ARM  Final   Special Requests   Final    BOTTLES DRAWN AEROBIC AND ANAEROBIC Blood Culture results may not be optimal due to an inadequate volume of blood received in culture bottles   Culture   Final    NO GROWTH 3 DAYS Performed at [facility], [address] [place], [place] [address]    Report Status PENDING  Incomplete    Please note: You were cared for by a hospitalist during your hospital stay. Once you are discharged, your primary care physician will handle any further medical issues. Please note that NO REFILLS for any discharge medications will be authorized once you are discharged, as it is imperative that you return to your primary care physician (or establish a relationship with a primary care physician if you do not have one) for your post hospital discharge needs so that they can reassess your need for medications and monitor your lab values.    Time coordinating discharge: 40 minutes  SIGNED:   Leona Rake, MD  [facility] 01/03/2024, 10:21 AM Pager [phone]  If 7PM-7AM, please contact night-coverage [email or web address] Password [email or web address]

## 2024-01-03 NOTE — Plan of Care (Signed)
 Patient alert/oriented X4. Patient compliant with medication administration this morning. VSS, no complaints at this time.  Problem: Education: Goal: Knowledge of General Education information will improve Description: Including pain rating scale, medication(s)/side effects and non-pharmacologic comfort measures Outcome: Adequate for Discharge   Problem: Health Behavior/Discharge Planning: Goal: Ability to manage health-related needs will improve Outcome: Adequate for Discharge   Problem: Clinical Measurements: Goal: Ability to maintain clinical measurements within normal limits will improve Outcome: Adequate for Discharge   Problem: Clinical Measurements: Goal: Will remain free from infection Outcome: Adequate for Discharge   Problem: Clinical Measurements: Goal: Diagnostic test results will improve Outcome: Adequate for Discharge   Problem: Clinical Measurements: Goal: Respiratory complications will improve Outcome: Adequate for Discharge   Problem: Clinical Measurements: Goal: Cardiovascular complication will be avoided Outcome: Adequate for Discharge   Problem: Activity: Goal: Risk for activity intolerance will decrease Outcome: Adequate for Discharge   Problem: Nutrition: Goal: Adequate nutrition will be maintained Outcome: Adequate for Discharge   Problem: Coping: Goal: Level of anxiety will decrease Outcome: Adequate for Discharge   Problem: Elimination: Goal: Will not experience complications related to bowel motility Outcome: Adequate for Discharge   Problem: Elimination: Goal: Will not experience complications related to urinary retention Outcome: Adequate for Discharge   Problem: Pain Management: Goal: General experience of comfort will improve Outcome: Adequate for Discharge   Problem: Safety: Goal: Ability to remain free from injury will improve Outcome: Adequate for Discharge   Problem: Skin Integrity: Goal: Risk for impaired skin integrity  will decrease Outcome: Adequate for Discharge

## 2024-01-03 NOTE — TOC Transition Note (Signed)
 Transition of Care [facility]) - Discharge Note   Patient Details  Name: Elaine Martinez MRN: [id number] Date of Birth: 1961-10-22  [facility] of [facility] CM/SW Contact:  Omie Bickers, RN Phone Number: 01/03/2024, 10:10 AM   Clinical Narrative:     HH services accepted through [facility].  Patient updated.  DME delivered.      Barriers to Discharge: Continued Medical Work up   Patient Goals and CMS Choice            Discharge Placement                       Discharge Plan and Services Additional resources added to the After Visit Summary for     Discharge Planning Services: CM Consult            DME Arranged: Bedside commode, Walker rolling DME Agency: [facility] Date DME Agency Contacted: 01/02/24 Time DME Agency Contacted: 1001 Representative spoke with at DME Agency: Marlou Sims HH Arranged: PT HH Agency: [facility] Date HH Agency Contacted: 01/03/24 Time HH Agency Contacted: 1009 Representative spoke with at [facility] Agency: Marliss Simple  Social Drivers of Health (SDOH) Interventions SDOH Screenings   Food Insecurity: No Food Insecurity (12/30/2023)  Housing: High Risk (12/30/2023)  Transportation Needs: No Transportation Needs (12/30/2023)  Utilities: Not At Risk (12/30/2023)  Tobacco Use: High Risk (12/29/2023)     Readmission Risk Interventions     No data to display

## 2024-01-04 ENCOUNTER — Telehealth: Payer: Self-pay | Admitting: Podiatry

## 2024-01-04 NOTE — Telephone Encounter (Signed)
Called again to confirm email to send her letter to and no answer and vm is full.

## 2024-01-04 NOTE — Telephone Encounter (Signed)
Called pt 2 times today returning call from yesterday. Yesterday she left message to email the letter to her at email was [email or web address].   I just want to confirm email before sending ths letter

## 2024-01-05 LAB — CULTURE, BLOOD (ROUTINE X 2)
Culture: NO GROWTH
Culture: NO GROWTH

## 2024-01-08 NOTE — Telephone Encounter (Signed)
Lvm for pt to call to give me information on where to send letter as I wanted to confirm email was correct.

## 2024-01-15 ENCOUNTER — Ambulatory Visit (INDEPENDENT_AMBULATORY_CARE_PROVIDER_SITE_OTHER): Payer: BLUE CROSS/BLUE SHIELD | Admitting: Podiatry

## 2024-01-15 ENCOUNTER — Encounter: Payer: Self-pay | Admitting: Podiatry

## 2024-01-15 DIAGNOSIS — M10072 Idiopathic gout, left ankle and foot: Secondary | ICD-10-CM | POA: Diagnosis not present

## 2024-01-15 DIAGNOSIS — M795 Residual foreign body in soft tissue: Secondary | ICD-10-CM | POA: Diagnosis not present

## 2024-01-15 MED ORDER — COLCHICINE 0.6 MG PO TABS: 0.6000 mg | ORAL_TABLET | Freq: Every day | ORAL | 0 refills | Status: AC

## 2024-01-15 NOTE — Progress Notes (Unsigned)
Subjective:   Patient ID: Elaine Martinez, female   DOB: 63 y.o.   MRN: [id number]   HPI Chief Complaint  Patient presents with   Post-op Problem    RM#12 POV  left foot follow up patient states experiencing severe pain. Went to emergency room was given antibiotics and pain medication nothing is helping. Diagnostic imaging done through [facility].   63 year old female presents as above concerns.  She states that she still having some pain since being in the hospital but it is better than what it was previously.  She did complete a course of prednisone as well as antibiotics.  She does not recall any injuries to the foot and she does not report any open lesions.  She has no fevers or chills.  States that she still has some antibiotics but that she is not taking that as prescribed.  She states that she was told she may have a foreign body in her big toe.  She does not remember stepping on anything.   Review of Systems  All other systems reviewed and are negative.  Past Medical History:  Diagnosis Date   Diverticulitis of intestine with abscess     Past Surgical History:  Procedure Laterality Date   HERNIA REPAIR       Current Outpatient Medications:    albuterol (VENTOLIN HFA) 108 (90 Base) MCG/ACT inhaler, Inhale 2 puffs into the lungs every 6 (six) hours as needed for wheezing or shortness of breath., Disp: 8 g, Rfl: 2   aspirin 81 MG EC tablet, Take 81 mg by mouth daily., Disp: , Rfl:    Biotin 10 MG CAPS, Take 10 mg by mouth once a week., Disp: , Rfl:    clobetasol ointment (TEMOVATE) 0.05 %, Apply 1 Application topically at bedtime as needed., Disp: 30 g, Rfl: 0   colchicine 0.6 MG tablet, Take 1 tablet (0.6 mg total) by mouth daily., Disp: 7 tablet, Rfl: 0   Ferrous Sulfate (IRON PO), Take 1 tablet by mouth every 14 (fourteen) days., Disp: , Rfl:    hydrOXYzine (ATARAX) 25 MG tablet, Take 25 mg by mouth at bedtime as needed for anxiety (Sleep)., Disp: , Rfl:    ketoconazole  (NIZORAL) 2 % cream, Apply 1 Application topically daily as needed for irritation., Disp: , Rfl:    lisinopril-hydrochlorothiazide (ZESTORETIC) 20-25 MG tablet, Take 1 tablet by mouth daily. (Patient taking differently: Take 1 tablet by mouth daily as needed (HBP).), Disp: 30 tablet, Rfl: 1   mometasone-formoterol (DULERA) 200-5 MCG/ACT AERO, Inhale 2 puffs into the lungs 2 (two) times daily. (Patient taking differently: Inhale 2 puffs into the lungs 2 (two) times daily as needed for wheezing or shortness of breath.), Disp: 1 each, Rfl: 2   Multiple Vitamin (MULTIVITAMIN PO), Take 1 tablet by mouth daily., Disp: , Rfl:    oxyCODONE (OXY IR/ROXICODONE) 5 MG immediate release tablet, Take 1 tablet (5 mg total) by mouth every 6 (six) hours as needed for moderate pain (pain score 4-6)., Disp: 10 tablet, Rfl: 0   VITAMIN D PO, Take 1 tablet by mouth every other day., Disp: , Rfl:    VITAMIN E PO, Take 1 capsule by mouth daily., Disp: , Rfl:   No Known Allergies        Objective:  Physical Exam  General: AAO x3, NAD  Dermatological: There is some mild edema present to the foot still there is no ascending erythema or any increased warmth.  There is no open lesions  identified.  Vascular: Dorsalis Pedis artery and Posterior Tibial artery pedal pulses are 2/4 bilateral with immedate capillary fill time.  There is no pain with calf compression, swelling, warmth, erythema.   Neruologic: Grossly intact via light touch bilateral.   Musculoskeletal: She has tenderness more submetatarsal 1 on the lesser MPJs 2 through 4 as well as the dorsal aspect of midfoot.  Not able to patient any area pinpoint tenderness significant pain to the hallux itself there is no open lesions identified at this area.  Flexor, extensor tendons appear to be intact.  Gait: Unassisted, Nonantalgic.       Assessment:   63 year old female with left foot pain     Plan:  -Treatment options discussed including all  alternatives, risks, and complications -I reviewed the x-rays and MRI from the hospital as well as well as her chart.  I am concerned more about gout as the uric acid was 12.3.  Her symptoms seem to have improved although it is hard to tell if it was from the steroids and the antibiotics.  Clinically does not appear to be infected today.  Will prescribe colchicine.  If needed will likely do a Medrol Dosepak after symptoms are started improving by the end of the week.  Finish the course of antibiotics as well.  She can transition to regular shoe as tolerated.  She can return to work once her symptoms improve or resolve.  I will see her back in about 1 week but should symptoms worsen prior to that, me know. -All of the studies do show foreign body in the big toe she has no pain with this and I do not think this is causing her issues at this time.  However if her symptoms were to change or worsen consider excision, removal of this.  Vivi Barrack DPM      Skipped some abx

## 2024-01-22 ENCOUNTER — Encounter: Payer: Self-pay | Admitting: Podiatry

## 2024-01-22 ENCOUNTER — Ambulatory Visit (INDEPENDENT_AMBULATORY_CARE_PROVIDER_SITE_OTHER): Payer: BLUE CROSS/BLUE SHIELD | Admitting: Podiatry

## 2024-01-22 ENCOUNTER — Telehealth: Payer: Self-pay | Admitting: Podiatry

## 2024-01-22 DIAGNOSIS — M10072 Idiopathic gout, left ankle and foot: Secondary | ICD-10-CM

## 2024-01-22 DIAGNOSIS — M795 Residual foreign body in soft tissue: Secondary | ICD-10-CM | POA: Diagnosis not present

## 2024-01-22 NOTE — Telephone Encounter (Signed)
Completed FMLA paperwork from patient's employer [employer]) - received from Dr. Ardelle Anton.  He said the patient was out of work 12/29/2023 and will return to work 01/29/2024.  Called patient to advise paperwork was complete -- she will come by the office tomorrow morning to pick it up -- advised will be behind front desk in [place] .Marland Kitchen...   J. Abbott -- 01/22/2024.

## 2024-01-22 NOTE — Progress Notes (Unsigned)
Subjective: Chief Complaint  Patient presents with   Foot Pain    RM#12 Follow up on left foot pain patient states feels sharpness in foot.   64 year old female presents the office with above concerns.  She states that she is been wearing a shoe for past 2 weeks and the swelling is much improved.  She has been trying to stand and walk for 8 hours.  She feels that she.  She said that she will be ready to return to work next week.  No open lesions.  No redness or open lesions that she reports.   Objective: AAO x3, NAD DP/PT pulses palpable bilaterally, CRT less than 3 seconds There is still some mild edema present of the foot but overall much improved.  There is no significant erythema but no increase in temperature.  Only area discomfort is along the sesamoids plantarly.  No open lesions or any evidence of foreign body to this area.  There is no areas of fluctuation or crepitation. No pain with calf compression, swelling, warmth, erythema  Assessment: Left foot pain, improving; gout  Plan: -All treatment options discussed with the patient including all alternatives, risks, complications.  -Overall doing better.  Will check a CBC, uric acid, sed rate, CRP. -Continue regular shoe gear and increase activity as tolerated. -Continue ice, elevation.  Discussed compression and she has compression socks at home. -Monitor for any clinical signs or symptoms of infection and directed to call the office immediately should any occur or go to the ER. -Will plan to return to work on 01/29/2024 -Patient encouraged to call the office with any questions, concerns, change in symptoms.    Vivi Barrack DPM

## 2024-01-23 ENCOUNTER — Other Ambulatory Visit: Payer: Self-pay | Admitting: Podiatry

## 2024-01-23 LAB — CBC WITH DIFFERENTIAL/PLATELET
Basophils Absolute: 0 10*3/uL (ref 0.0–0.2)
Basos: 1 %
EOS (ABSOLUTE): 0.2 10*3/uL (ref 0.0–0.4)
Eos: 2 %
Hematocrit: 39.3 % (ref 34.0–46.6)
Hemoglobin: 13.2 g/dL (ref 11.1–15.9)
Immature Grans (Abs): 0.1 10*3/uL (ref 0.0–0.1)
Immature Granulocytes: 1 %
Lymphocytes Absolute: 2.6 10*3/uL (ref 0.7–3.1)
Lymphs: 30 %
MCH: 32.8 pg (ref 26.6–33.0)
MCHC: 33.6 g/dL (ref 31.5–35.7)
MCV: 98 fL — ABNORMAL HIGH (ref 79–97)
Monocytes Absolute: 0.9 10*3/uL (ref 0.1–0.9)
Monocytes: 10 %
Neutrophils Absolute: 4.8 10*3/uL (ref 1.4–7.0)
Neutrophils: 56 %
Platelets: 286 10*3/uL (ref 150–450)
RBC: 4.02 x10E6/uL (ref 3.77–5.28)
RDW: 11.3 % — ABNORMAL LOW (ref 11.7–15.4)
WBC: 8.6 10*3/uL (ref 3.4–10.8)

## 2024-01-23 LAB — SEDIMENTATION RATE: Sed Rate: 39 mm/h (ref 0–40)

## 2024-01-23 LAB — C-REACTIVE PROTEIN: CRP: 14 mg/L — ABNORMAL HIGH (ref 0–10)

## 2024-01-23 LAB — URIC ACID: Uric Acid: 10.8 mg/dL — ABNORMAL HIGH (ref 3.0–7.2)

## 2024-01-23 MED ORDER — ALLOPURINOL 100 MG PO TABS: 100.0000 mg | ORAL_TABLET | Freq: Every day | ORAL | 1 refills | Status: DC

## 2024-01-29 ENCOUNTER — Encounter: Payer: Self-pay | Admitting: Podiatry

## 2024-01-29 ENCOUNTER — Ambulatory Visit (INDEPENDENT_AMBULATORY_CARE_PROVIDER_SITE_OTHER): Payer: BLUE CROSS/BLUE SHIELD | Admitting: Podiatry

## 2024-01-29 DIAGNOSIS — R609 Edema, unspecified: Secondary | ICD-10-CM | POA: Diagnosis not present

## 2024-01-29 DIAGNOSIS — M10072 Idiopathic gout, left ankle and foot: Secondary | ICD-10-CM

## 2024-01-29 MED ORDER — METHYLPREDNISOLONE 4 MG PO TBPK: ORAL_TABLET | ORAL | 0 refills | Status: DC

## 2024-01-31 NOTE — Progress Notes (Signed)
Subjective: Chief Complaint  Patient presents with   Foot Pain    RM#11 Follow up on left foot pain not getting better has gotten worse. Has purchased new supportive shoes.    63 year old female presents the office with above concerns.  She has started allopurinol.  After soft she went got new shoes and insoles which are comfortable but the next day she started having worsening foot pain more to the top of her foot.  She does not recall any injuries or changes otherwise.  Some mild increase in swelling.  No open lesions.   Objective: AAO x3, NAD DP/PT pulses palpable bilaterally, CRT less than 3 seconds There is still mild edema present to the slight increase in temperature there is no erythema or warmth.  I am not entirely appreciate any area pinpoint tenderness.  Flexor, extensor tendons are intact.  There is no open lesions identified.  No pain to the ankle or the ankle joint range of motion. No pain with calf compression, swelling, warmth, erythema  Assessment: Left foot pain, gout  Plan: -All treatment options discussed with the patient including all alternatives, risks, complications.  -Just started allopurinol which I think will help bring the uric acid down will plan to recheck this next couple weeks.  Prescribed Medrol Dosepak.  Think some of her symptoms to be coming from the new shoes, insoles that she did not break them in.  There is no signs of infection noted today otherwise we will continue to monitor.  For now we will let her return to work when she feels more comfortable with for now will remain out of work until her symptoms improve. -Monitor for any clinical signs or symptoms of infection and directed to call the office immediately should any occur or go to the ER.  Return in about 10 days (around 02/08/2024).  Vivi Barrack DPM

## 2024-02-08 ENCOUNTER — Encounter: Payer: Self-pay | Admitting: Podiatry

## 2024-02-08 ENCOUNTER — Ambulatory Visit (INDEPENDENT_AMBULATORY_CARE_PROVIDER_SITE_OTHER): Payer: BLUE CROSS/BLUE SHIELD | Admitting: Podiatry

## 2024-02-08 ENCOUNTER — Ambulatory Visit (INDEPENDENT_AMBULATORY_CARE_PROVIDER_SITE_OTHER): Payer: BLUE CROSS/BLUE SHIELD

## 2024-02-08 DIAGNOSIS — M778 Other enthesopathies, not elsewhere classified: Secondary | ICD-10-CM

## 2024-02-08 DIAGNOSIS — M795 Residual foreign body in soft tissue: Secondary | ICD-10-CM

## 2024-02-12 NOTE — Progress Notes (Signed)
 Subjective: Chief Complaint  Patient presents with   Foot Pain    RM#11 Left foot pain patient states that the cushion given makes her pain worse.Pain scale is a 10 states foot hurts really bad.     63 year old female presents the office with above concerns.  She said that she was feeling better but the pain has seemed to worsen again.  She does not report any recent injury or falls.  She has not any fevers or chills.  She is asked about removal of foreign body.  Her pain is to the lateral aspect of the foot but she points to more on sinus tarsi.  She is currently taking allopurinol.  She has finished a course of steroids which she does feel helps.    Objective: AAO x3, NAD DP/PT pulses palpable bilaterally, CRT less than 3 seconds There is still edema present to the foot but does not appear to be significantly worse compared to what it has been.  There is no increased temperature compared to contralateral extremity.  The majority tenderness today is along the lateral aspect: Sinus tarsi and along the EDB muscle belly.  Unable to reach any area pinpoint tenderness.  Almost hallux there is no open lesion or puncture wound noted.  There is no edema, erythema or signs of infection localized to this area. No pain with calf compression, swelling, warmth, erythema  Assessment: Left foot pain, gout  Plan: -All treatment options discussed with the patient including all alternatives, risks, complications.  -Repeat x-rays obtained reviewed.  Small punctate foreign body on the plantar aspect of hallux.  There is no evidence of acute fracture. -We discussed removal of the foreign object.  However I did not notice this was causing her pain overall to the foot.  No signs of infection by I discussed with her how to remove the foreign object and she was to proceed with this I be happy to do it for her for you to agree to hold off on this today as I do not use was causing her pain or symptoms.  I do think a  lot of her symptoms are more likely result of gout.  I am going to recheck a uric acid also will check CBC, sed rate, CRP.  Today we discussed a steroid injection in the area of tenderness along the sinus tarsi, lateral foot she wishes to proceed.  He was given Betadine.  Mixture of 1 cc Kenalog 10, 0.5 cc of Marcaine plain, 0.5 cc of lidocaine plain was infiltrated the area maximal tenderness complications.  Postinjection care discussed.  Tolerated well -Discussed returning to the cam boot short-term -Monitor for any clinical signs or symptoms of infection and directed to call the office immediately should any occur or go to the ER.  Return in about 10 days (around 02/18/2024).  Vivi Barrack DPM

## 2024-02-14 ENCOUNTER — Other Ambulatory Visit: Payer: Self-pay | Admitting: Podiatry

## 2024-02-14 ENCOUNTER — Telehealth: Payer: Self-pay | Admitting: Podiatry

## 2024-02-14 MED ORDER — HYDROCODONE-ACETAMINOPHEN 5-325 MG PO TABS: 1.0000 | ORAL_TABLET | Freq: Four times a day (QID) | ORAL | 0 refills | Status: DC | PRN

## 2024-02-14 NOTE — Telephone Encounter (Signed)
 Pt called and she got the shots and was not feeling pain but the shots have wore off and she is needing some pain medication sent in. Pharmacy correct in chart.

## 2024-02-15 ENCOUNTER — Other Ambulatory Visit: Payer: Self-pay | Admitting: Podiatry

## 2024-02-15 LAB — CBC WITH DIFFERENTIAL/PLATELET
Basophils Absolute: 0 10*3/uL (ref 0.0–0.2)
Basos: 0 %
EOS (ABSOLUTE): 0.2 10*3/uL (ref 0.0–0.4)
Eos: 1 %
Hematocrit: 39.2 % (ref 34.0–46.6)
Hemoglobin: 13.1 g/dL (ref 11.1–15.9)
Immature Grans (Abs): 0 10*3/uL (ref 0.0–0.1)
Immature Granulocytes: 0 %
Lymphocytes Absolute: 2.6 10*3/uL (ref 0.7–3.1)
Lymphs: 19 %
MCH: 32.3 pg (ref 26.6–33.0)
MCHC: 33.4 g/dL (ref 31.5–35.7)
MCV: 97 fL (ref 79–97)
Monocytes Absolute: 0.9 10*3/uL (ref 0.1–0.9)
Monocytes: 7 %
Neutrophils Absolute: 9.6 10*3/uL — ABNORMAL HIGH (ref 1.4–7.0)
Neutrophils: 73 %
Platelets: 274 10*3/uL (ref 150–450)
RBC: 4.06 x10E6/uL (ref 3.77–5.28)
RDW: 11.1 % — ABNORMAL LOW (ref 11.7–15.4)
WBC: 13.4 10*3/uL — ABNORMAL HIGH (ref 3.4–10.8)

## 2024-02-15 LAB — BASIC METABOLIC PANEL
BUN/Creatinine Ratio: 10 — ABNORMAL LOW (ref 12–28)
BUN: 10 mg/dL (ref 8–27)
CO2: 18 mmol/L — ABNORMAL LOW (ref 20–29)
Calcium: 10.4 mg/dL — ABNORMAL HIGH (ref 8.7–10.3)
Chloride: 103 mmol/L (ref 96–106)
Creatinine, Ser: 1.05 mg/dL — ABNORMAL HIGH (ref 0.57–1.00)
Glucose: 80 mg/dL (ref 70–99)
Potassium: 4.7 mmol/L (ref 3.5–5.2)
Sodium: 139 mmol/L (ref 134–144)
eGFR: 60 mL/min/{1.73_m2} (ref >59)

## 2024-02-15 LAB — URIC ACID: Uric Acid: 5.3 mg/dL (ref 3.0–7.2)

## 2024-02-15 MED ORDER — DOXYCYCLINE HYCLATE 100 MG PO TABS: 100.0000 mg | ORAL_TABLET | Freq: Two times a day (BID) | ORAL | 0 refills | Status: DC

## 2024-02-15 NOTE — Telephone Encounter (Signed)
 Called pt and notified her you sent in some pain medication and if she is going to be working she should not take the medication. She stated she is not working but her foot has flared up again and the shots you gave her really helped at first. I told her to try the medication and if it did not help to call us back. She is scheduled to see you on 3/3( Monday). She also said she had her labs done yesterday as you requested.

## 2024-02-16 ENCOUNTER — Telehealth: Payer: Self-pay | Admitting: Podiatry

## 2024-02-16 NOTE — Telephone Encounter (Signed)
 Patient called stating Dr Ardelle Anton called her yesterday. I looked in the chart looks like a RX was sent over. Let pt know she said she knew. I told her I would send a message to Dr Ardelle Anton she wanted me to let him know that her foot is more swollen then it was and she can't walk.

## 2024-02-19 ENCOUNTER — Encounter: Payer: Self-pay | Admitting: Podiatry

## 2024-02-19 ENCOUNTER — Ambulatory Visit (INDEPENDENT_AMBULATORY_CARE_PROVIDER_SITE_OTHER): Payer: BLUE CROSS/BLUE SHIELD | Admitting: Podiatry

## 2024-02-19 DIAGNOSIS — M778 Other enthesopathies, not elsewhere classified: Secondary | ICD-10-CM

## 2024-02-19 DIAGNOSIS — M795 Residual foreign body in soft tissue: Secondary | ICD-10-CM

## 2024-02-19 DIAGNOSIS — M10072 Idiopathic gout, left ankle and foot: Secondary | ICD-10-CM | POA: Diagnosis not present

## 2024-02-19 NOTE — Patient Instructions (Signed)

## 2024-02-20 ENCOUNTER — Telehealth: Payer: Self-pay | Admitting: Podiatry

## 2024-02-20 NOTE — Telephone Encounter (Signed)
 DOS-02/21/24  INCISION AND REMOVAL FOREIGN BODY-10121 BONE BIOPSY LT-20220  [employer] EFFECTIVE DATE- 01/20/24  SPOKE WITH KIM P. FROM [employer] AND SHE STATED THAT PRIOR AUTH IS NOT REQUIRED FOR CPT CODES 16109 AND 20220.  CALL REF #: KIM P. 02/20/24   [employer] EFFECTIVE DATE- 12/20/23  SPOKE WITH JEFF R. FROM [employer] AND HE STATED THAT PRIOR AUTH IS NOT REQUIRED FOR CPT CODES 60454 AND 20220.  CALL REF #: JEFF R. 02/20/24 @ 9:29 AM EST

## 2024-02-20 NOTE — Progress Notes (Signed)
 Subjective: Chief Complaint  Patient presents with   Foot Pain    RM#11 Left foot pain doing some what better but not 100%.      63 year old female presents the office with above concerns.  She said that overall she is doing better.  She is compliant taking the antibiotics.  She is walking in a surgical shoe.  Does not report any open lesions.  Currently denies any increase in warmth she does the swelling is improved to her foot.  She does not report any fevers or chills.   Objective: AAO x3, NAD DP/PT pulses palpable bilaterally, CRT less than 3 seconds There is still edema present to the foot but overall improved there is no erythema or warmth of the foot.  No open lesions identified.  The tenderness that she has been on the lateral aspect of the foot is improved by able to express area pinpoint tenderness.  Most of the tenderness is some metatarsal mostly submetatarsal 1.  Minimal occasional discomfort along the hallux elongated foreign body noted on x-ray.  No areas of fluctuation or crepitation.  Ankle, subtalar range of motion intact. No pain with calf compression, swelling, warmth, erythema  Assessment: Left foot pain, gout  Plan: -All treatment options discussed with the patient including all alternatives, risks, complications.  -We had an extensive conversation again today regarding treatment options.  Although clinically she seems to doing better today she is back on antibiotics.  Her uric acid level is also much improved.  I do think that a lot of this is gout.  She does not remember when she stepped on a foreign object there is no obvious signs of infection open lesion or puncture wound but given the concern about recurrence of infection we discussed incision and drainage, debridement possible bone biopsy and removal of foreign object.  She is well aware that may not be able to remove the foreign object given the size of this but I do think it warrants evaluation make sure there is  no underlying infection.  It has been difficult as clinically there is no signs of infection to the toe with no significant pain to the toe and it is not where she has been having her discomfort.  She has a follow-up with discussed and she wants to proceed with surgery.  Will plan on doing surgery on Wednesday.  She been offered a second opinion -The incision placement as well as the postoperative course was discussed with the patient. I discussed risks of the surgery which include, but not limited to, infection, bleeding, pain, swelling, need for further surgery, delayed or nonhealing, painful or ugly scar, numbness or sensation changes, over/under correction, recurrence, transfer lesions, further deformity,  DVT/PE, loss of toe/foot. Patient understands these risks and wishes to proceed with surgery. The surgical consent was reviewed with the patient all 3 pages were signed. No promises or guarantees were given to the outcome of the procedure. All questions were answered to the best of my ability. Before the surgery the patient was encouraged to call the office if there is any further questions. The surgery will be performed at the [facility] on an outpatient basis.  No follow-ups on file.  Vivi Barrack DPM   -Repeat x-rays obtained reviewed.  Small punctate foreign body on the plantar aspect of hallux.  There is no evidence of acute fracture. -We discussed removal of the foreign object.  However I did not notice this was causing her pain overall to the foot.  No signs of infection by I discussed with her how to remove the foreign object and she was to proceed with this I be happy to do it for her for you to agree to hold off on this today as I do not use was causing her pain or symptoms.  I do think a lot of her symptoms are more likely result of gout.  I am going to recheck a uric acid also will check CBC, sed rate, CRP.  Today we discussed a steroid injection in the area of tenderness along the sinus  tarsi, lateral foot she wishes to proceed.  He was given Betadine.  Mixture of 1 cc Kenalog 10, 0.5 cc of Marcaine plain, 0.5 cc of lidocaine plain was infiltrated the area maximal tenderness complications.  Postinjection care discussed.  Tolerated well -Discussed returning to the cam boot short-term -Monitor for any clinical signs or symptoms of infection and directed to call the office immediately should any occur or go to the ER.  Return in about 10 days (around 02/18/2024).  Vivi Barrack DPM

## 2024-02-21 ENCOUNTER — Other Ambulatory Visit: Payer: Self-pay | Admitting: Podiatry

## 2024-02-21 DIAGNOSIS — M795 Residual foreign body in soft tissue: Secondary | ICD-10-CM | POA: Diagnosis not present

## 2024-02-21 DIAGNOSIS — M86172 Other acute osteomyelitis, left ankle and foot: Secondary | ICD-10-CM | POA: Diagnosis not present

## 2024-02-21 MED ORDER — DOXYCYCLINE HYCLATE 100 MG PO TABS: 100.0000 mg | ORAL_TABLET | Freq: Two times a day (BID) | ORAL | 0 refills | Status: DC

## 2024-02-21 MED ORDER — HYDROCODONE-ACETAMINOPHEN 5-325 MG PO TABS: 1.0000 | ORAL_TABLET | Freq: Four times a day (QID) | ORAL | 0 refills | Status: AC | PRN

## 2024-02-23 ENCOUNTER — Telehealth: Payer: Self-pay | Admitting: Podiatry

## 2024-02-23 NOTE — Telephone Encounter (Signed)
 Called patient to check on her post-op. No answer. Left VM to call back if she needs anything.

## 2024-02-26 ENCOUNTER — Encounter: Payer: Self-pay | Admitting: Podiatry

## 2024-02-27 ENCOUNTER — Encounter: Admitting: Podiatry

## 2024-02-29 ENCOUNTER — Ambulatory Visit (INDEPENDENT_AMBULATORY_CARE_PROVIDER_SITE_OTHER): Admitting: Podiatry

## 2024-02-29 ENCOUNTER — Encounter: Payer: Self-pay | Admitting: Podiatry

## 2024-02-29 ENCOUNTER — Ambulatory Visit (INDEPENDENT_AMBULATORY_CARE_PROVIDER_SITE_OTHER)

## 2024-02-29 DIAGNOSIS — M7752 Other enthesopathy of left foot: Secondary | ICD-10-CM | POA: Diagnosis not present

## 2024-02-29 DIAGNOSIS — S90852S Superficial foreign body, left foot, sequela: Secondary | ICD-10-CM

## 2024-03-02 NOTE — Progress Notes (Signed)
 Subjective: Chief Complaint  Patient presents with   Routine Post Op    RM#13 POV #1 DOS 02/21/2024 LT FOOT I&D POSS REMOVAL OF FORIGEN BODY, POSS BONE BIOPSY-patient states doing very well no pain.   63 year old female presents the office with above concerns.  She states has been feeling great is not having any pain.  She does not report any fevers or chills.  She is not taking any pain medication.  She has returned to work with modifications as they found a job that she can do.  No other concerns today.  Objective: AAO x3, NAD DP/PT pulses palpable bilaterally, CRT less than 3 seconds Incisions are well coapted with sutures intact.  There is some slight edema present to the hallux and the surgical is no erythema, warmth or any signs of infection.  She is with some mild discomfort on the lateral aspect of the midfoot but no specific area pinpoint tenderness.  Overall she states that her foot is been feeling much better. No pain with calf compression, swelling, warmth, erythema  Assessment: Status post foreign body removal, bone biopsy  Plan: -All treatment options discussed with the patient including all alternatives, risks, complications.  -X-rays obtained and reviewed.  3 views were obtained.  Evidence of bone biopsies noted.  No evidence of acute fracture.  Status post foreign body removal. -Overall her symptoms are much improved.  I still think a lot of her symptoms are coming from gout but also could be result of foreign object although she never had significant pain prior her symptoms have much improved since his surgery.  There is no signs of infection we will continue to monitor the cultures as well as follow-up pathology. -Xeroform applied followed by dressing.  Discussed that she can wash with soap and water, dry thoroughly apply a similar bandage.  Continue surgical shoe, elevation.  Limited weightbearing. -Monitor for any clinical signs or symptoms of infection and directed to call  the office immediately should any occur or go to the ER.  No follow-ups on file.  Vivi Barrack DPM

## 2024-03-07 ENCOUNTER — Encounter: Admitting: Podiatry

## 2024-03-14 ENCOUNTER — Encounter: Admitting: Podiatry

## 2024-03-21 ENCOUNTER — Encounter: Payer: Self-pay | Admitting: Podiatry

## 2024-03-21 ENCOUNTER — Ambulatory Visit (INDEPENDENT_AMBULATORY_CARE_PROVIDER_SITE_OTHER)

## 2024-03-21 ENCOUNTER — Ambulatory Visit (INDEPENDENT_AMBULATORY_CARE_PROVIDER_SITE_OTHER): Admitting: Podiatry

## 2024-03-21 DIAGNOSIS — M7751 Other enthesopathy of right foot: Secondary | ICD-10-CM | POA: Diagnosis not present

## 2024-03-21 DIAGNOSIS — M79671 Pain in right foot: Secondary | ICD-10-CM | POA: Diagnosis not present

## 2024-03-21 DIAGNOSIS — M778 Other enthesopathies, not elsewhere classified: Secondary | ICD-10-CM

## 2024-03-21 DIAGNOSIS — Z9889 Other specified postprocedural states: Secondary | ICD-10-CM

## 2024-03-21 DIAGNOSIS — M795 Residual foreign body in soft tissue: Secondary | ICD-10-CM

## 2024-03-21 NOTE — Patient Instructions (Signed)

## 2024-03-21 NOTE — Progress Notes (Signed)
 Subjective: Chief Complaint  Patient presents with   Routine Post Op    RM#11 POV #3 DOS 02/21/2024 LT FOOT I&D POSS REMOVAL OF FORIGEN BODY, POSS BONE BIOPSY- Patient states left foot is doing well but states she is having trouble with swelling and pain in right foot would like injection.    63 year old female presents the office with above concerns.  She states her left foot feels great except she has had stitches removed.  Unfortunately she recently lost her friend and that is why she did not come in for her last appointment.    She has a secondary concern for started to get pain to the top of the right foot.  She does not report any injuries to the right foot.  She does get the swelling.  She points in the midfoot area where she has majority discomfort.    Objective: AAO x3, NAD DP/PT pulses palpable bilaterally, CRT less than 3 seconds Incisions are well coapted with sutures intact.  There is no evidence of dehiscence.  There is some slight edema but there is no erythema warmth.  No drainage or pus signs signs of infection noted today on the left foot. On the right foot there is tenderness palpation along the dorsal lateral aspect along the Lisfranc joint.  There is no erythema or tenderness.  There are some edema present but there is no erythema or warmth.  There are no open lesions noted.  The right foot. No pain with calf compression, swelling, warmth, erythema  Assessment: Status post foreign body removal, bone biopsy; right foot capsulitis  Plan: -All treatment options discussed with the patient including all alternatives, risks, complications.  -X-rays obtained and reviewed.  3 views were obtained.  Evidence of bone biopsies noted.  No evidence of acute fracture.  Status post foreign body removal. On the right foot there is no evidence of acute fracture noted today. -On the left of the sutures today without any complications.  There is no pain on the left foot and she is doing well.   She can transition back to regular shoe as tolerated.  Continue to elevate as well as compression With residual edema.  Right foot pain -She is requesting steroid injection.  Plantar skin with alcohol.  Mixture of 1 cc Kenalog 10, 0.5 cc Marcaine 105 cc infiltrated into the dorsal aspect of the right midfoot (around the Lisfranc Joint, intermediate joint) without complications.  Postinjection care discussed.  She tolerated well. -She can wear the surgical shoe on the right.   -Note provided for light duty at work.  Return in about 3 weeks (around 04/11/2024).  Vivi Barrack DPM

## 2024-03-28 ENCOUNTER — Encounter: Payer: Self-pay | Admitting: Podiatry

## 2024-03-29 ENCOUNTER — Encounter: Payer: Self-pay | Admitting: Podiatry

## 2024-04-02 ENCOUNTER — Encounter: Payer: Self-pay | Admitting: Podiatry

## 2024-04-12 ENCOUNTER — Ambulatory Visit: Admission: EM | Admit: 2024-04-12 | Discharge: 2024-04-12 | Disposition: A | Discharge status: Home / Self Care

## 2024-04-12 DIAGNOSIS — I952 Hypotension due to drugs: Secondary | ICD-10-CM | POA: Diagnosis not present

## 2024-04-12 DIAGNOSIS — E861 Hypovolemia: Secondary | ICD-10-CM | POA: Diagnosis not present

## 2024-04-12 LAB — POCT URINALYSIS DIP (MANUAL ENTRY)
Bilirubin, UA: NEGATIVE
Glucose, UA: NEGATIVE mg/dL
Ketones, POC UA: NEGATIVE mg/dL
Leukocytes, UA: NEGATIVE
Nitrite, UA: NEGATIVE
Protein Ur, POC: NEGATIVE mg/dL
Spec Grav, UA: 1.015 (ref 1.010–1.025)
Urobilinogen, UA: 0.2 U/dL
pH, UA: 6 (ref 5.0–8.0)

## 2024-04-12 MED ORDER — SODIUM CHLORIDE 0.9 % IV BOLUS
500.0000 mL | Freq: Once | INTRAVENOUS | Status: AC
  Administered 2024-04-12: 500 mL via INTRAVENOUS

## 2024-04-12 MED ORDER — CLOBETASOL PROPIONATE 0.05 % EX OINT: 1.0000 | TOPICAL_OINTMENT | Freq: Two times a day (BID) | CUTANEOUS | 0 refills | Status: AC

## 2024-04-12 NOTE — ED Provider Notes (Signed)
 [facility]    CSN: [id number] Arrival date & time: 04/12/24  1417      History   Chief Complaint Chief Complaint  Patient presents with   Hypotension   Dizziness    HPI Alonnie Bieker is a 63 y.o. female.  Patient presents today with 2 days of low blood pressure.  Patient reports yesterday taken a fluid pill and leftover hydrocodone  as she was having some knee pain.  Due to the knee pain she reports taking the fluid pill not thinking that it would also lower her blood pressure.  Her blood pressure was taken at work yesterday and was in the systolically and 60s diastolically.  Reports she has not taken any blood pressure or pain medication today. Reports she had a slight headache today. She is drinking plenty of fluids and reports she is currently not dizzy.  Past Medical History:  Diagnosis Date   Diverticulitis of intestine with abscess     Patient Active Problem List   Diagnosis Date Noted   Cellulitis of great toe of left foot 12/31/2023   Acute osteomyelitis of left foot (HCC) 12/30/2023   Morbid obesity with body mass index (BMI) of 40.0 to 44.9 in adult (HCC) 10/25/2023   Acute bronchitis 09/15/2023   HTN (hypertension) 09/15/2023   Obesity (BMI 30-39.9) 09/15/2023   Hypomagnesemia 09/15/2023   Dermatitis 11/07/2022   Abdominal pain 10/21/2020   Diarrhea 10/21/2020   AKI (acute kidney injury) (HCC) 10/21/2020   Metabolic acidosis 10/21/2020   SIRS (systemic inflammatory response syndrome) (HCC) 10/21/2020   Class 3 severe obesity due to excess calories with serious comorbidity and body mass index (BMI) of 40.0 to 44.9 in adult [facility]) 04/11/2019   Acute diverticulitis 12/06/2016   Tobacco abuse 12/06/2016   Morbid obesity due to excess calories (HCC) 04/04/2016   Chest pain in adult 04/04/2016   Essential hypertension 04/04/2016   Dermatitis, unspecified 03/28/2016   Tobacco dependence 03/28/2016    Past Surgical History:  Procedure Laterality Date    HERNIA REPAIR      OB History   No obstetric history on file.      Home Medications    Prior to Admission medications   Medication Sig Start Date End Date Taking? Authorizing Provider  allopurinol  (ZYLOPRIM ) 100 MG tablet TAKE 1 TABLET BY MOUTH EVERY DAY 02/15/24  Yes Charity Conch, DPM  Biotin 10 MG CAPS Take 10 mg by mouth once a week.   Yes [provider]  clobetasol  ointment (TEMOVATE ) 0.05 % Apply 1 Application topically 2 (two) times daily. 04/12/24  Yes Buena Carmine, NP  Multiple Vitamin (MULTIVITAMIN PO) Take 1 tablet by mouth daily.   Yes [provider]  VITAMIN D PO Take 1 tablet by mouth every other day.   Yes [provider]  VITAMIN E PO Take 1 capsule by mouth daily.   Yes [provider]  albuterol  (VENTOLIN  HFA) 108 (90 Base) MCG/ACT inhaler Inhale 2 puffs into the lungs every 6 (six) hours as needed for wheezing or shortness of breath. 09/16/23   Vada Garibaldi, MD  aspirin  81 MG EC tablet Take 81 mg by mouth daily. 04/05/16   [provider]  colchicine  0.6 MG tablet Take 1 tablet (0.6 mg total) by mouth daily. 01/15/24   Charity Conch, DPM  doxycycline  (VIBRA -TABS) 100 MG tablet Take 1 tablet (100 mg total) by mouth 2 (two) times daily. 02/21/24   Charity Conch, DPM  Ferrous Sulfate  (  IRON PO) Take 1 tablet by mouth every 14 (fourteen) days.    [provider]  HYDROcodone -acetaminophen  (NORCO/VICODIN) 5-325 MG tablet Take 1 tablet by mouth every 6 (six) hours as needed. 02/21/24   Charity Conch, DPM  hydrOXYzine (ATARAX) 25 MG tablet Take 25 mg by mouth at bedtime as needed for anxiety (Sleep). 10/24/23   [provider]  ketoconazole (NIZORAL) 2 % cream Apply 1 Application topically daily as needed for irritation. 10/25/23   [provider]  lisinopril -hydrochlorothiazide  (ZESTORETIC ) 20-25 MG tablet Take 1 tablet by mouth daily. Patient taking differently: Take 1 tablet by  mouth daily as needed (HBP). 10/17/22   Teddi Favors, DO  methylPREDNISolone  (MEDROL  DOSEPAK) 4 MG TBPK tablet Take as directed 01/29/24   Charity Conch, DPM  mometasone -formoterol  (DULERA ) 200-5 MCG/ACT AERO Inhale 2 puffs into the lungs 2 (two) times daily. Patient taking differently: Inhale 2 puffs into the lungs 2 (two) times daily as needed for wheezing or shortness of breath. 09/16/23   Vada Garibaldi, MD    Family History History reviewed. No pertinent family history.  Social History Social History   Tobacco Use   Smoking status: Every Day    Current packs/day: 0.50    Average packs/day: 0.5 packs/day for 50.6 years (25.3 ttl pk-yrs)    Types: Cigarettes    Start date: 09/14/1973   Smokeless tobacco: Never  Vaping Use   Vaping status: Never Used  Substance Use Topics   Alcohol use: Yes    Comment: occasional   Drug use: Never     Allergies   Patient has no known allergies.   Review of Systems Review of Systems  Neurological:  Positive for dizziness.     Physical Exam Triage Vital Signs ED Triage Vitals  Encounter Vitals Group     BP 04/12/24 1448 (!) 89/60     Systolic BP Percentile --      Diastolic BP Percentile --      Pulse Rate 04/12/24 1448 95     Resp 04/12/24 1448 18     Temp 04/12/24 1448 98.3 F (36.8 C)     Temp Source 04/12/24 1448 Oral     SpO2 04/12/24 1448 96 %     Weight 04/12/24 1444 260 lb (117.9 kg)     Height 04/12/24 1444 5\' 3"  (1.6 m)     Head Circumference --      Peak Flow --      Pain Score 04/12/24 1440 0     Pain Loc --      Pain Education --      Exclude from Growth Chart --    No data found.  Updated Vital Signs BP (!) 89/60 (BP Location: Left Arm)   Pulse 95   Temp 98.3 F (36.8 C) (Oral)   Resp 18   Ht 5\' 3"  (1.6 m)   Wt 260 lb (117.9 kg)   SpO2 96%   BMI 46.06 kg/m   Visual Acuity Right Eye Distance:   Left Eye Distance:   Bilateral Distance:    Right Eye Near:   Left Eye Near:    Bilateral  Near:     Physical Exam Vitals reviewed.  Constitutional:      Appearance: Normal appearance.  HENT:     Head: Normocephalic and atraumatic.  Eyes:     Extraocular Movements: Extraocular movements intact.     Pupils: Pupils are equal, round, and reactive to light.  Cardiovascular:  Rate and Rhythm: Normal rate and regular rhythm.  Pulmonary:     Effort: Pulmonary effort is normal.     Breath sounds: Normal breath sounds.  Musculoskeletal:        General: Normal range of motion.     Cervical back: Normal range of motion and neck supple.  Skin:    General: Skin is warm and dry.  Neurological:     General: No focal deficit present.     Mental Status: She is alert and oriented to person, place, and time.      UC Treatments / Results  Labs (all labs ordered are listed, but only abnormal results are displayed) Labs Reviewed  POCT URINALYSIS DIP (MANUAL ENTRY) - Abnormal; Notable for the following components:      Result Value   Blood, UA trace-intact (*)    All other components within normal limits  COMPREHENSIVE METABOLIC PANEL WITH GFR  CBC WITH DIFFERENTIAL/PLATELET    EKG   Radiology No results found.  Procedures Procedures (including critical care time)  Medications Ordered in UC Medications  sodium chloride  0.9 % bolus 500 mL (500 mLs Intravenous New Bag/Given 04/12/24 1610)    Initial Impression / Assessment and Plan / UC Course  I have reviewed the triage vital signs and the nursing notes.  Pertinent labs & imaging results that were available during my care of the patient were reviewed by me and considered in my medical decision making (see chart for details).    Patient presents today with hypotension x 24 hours initially blood pressure 89/60 on arrival, the lowest readings at home was in the 70s systolically and 60s diastolically.  Patient bolused with a half a liter of fluids, BP improved to 102/69.  Patient is asymptomatic of any dizziness, weakness  or any other symptoms.  EKG shows normal sinus rhythm with a ventricular rate of 94.  Patient is overall very well-appearing.  UA obtained unremarkable for UTI.  Took a diuretic and hydrocodone  yesterday and I suspect this has resulted in bottoming out of her blood pressure as well as lack of fluid intake.  Patient encouraged to go to the ED if her blood pressure drops again.  CBC and CMP is pending-CBC to rule out anemia or elevated white count, CMP to rule out any electrolyte abnormality or acute kidney injury.  If there is any abnormality of electrolytes, elevation in creatinine or BUN patient should go immediately to the emergency department.  If patient hemoglobin is below 8 given recent hypotension she should also be evaluated in the setting of the emergency department.  Patient advised that if she continues to have drops in blood pressure she should go for evaluation to the emergency department. Final Clinical Impressions(s) / UC Diagnoses   Final diagnoses:  Hypotension due to hypovolemia  Hypotension due to drugs   Discharge Instructions   None    ED Prescriptions     Medication Sig Dispense Auth. Provider   clobetasol  ointment (TEMOVATE ) 0.05 % Apply 1 Application topically 2 (two) times daily. 60 g Buena Carmine, NP      PDMP not reviewed this encounter.   Buena Carmine, NP 04/12/24 [phone]

## 2024-04-12 NOTE — Discharge Instructions (Addendum)
 Continue to monitor your blood pressure at home.  Stop aking any doses of hydrocodone , fluid pills or blood pressure medication. If your blood pressure measures below 90/60 consistently with you eating, drinking doing normal activities and you develop dizziness or weakness I would like for you to go to the emergency department as this is an abnormal blood pressure reading.  If you to follow-up with your primary care doctor within the next 2 to 4 weeks to evaluate blood pressure readings. I have refilled your clobetasol  cream.

## 2024-04-12 NOTE — ED Triage Notes (Signed)
"  I felt dizzy, lightheaded a work, BP was 75/54, BP did go up with drinking a lot more/eating but still up an down". No history of Hypotension "but am diagnosed with HTN". No chest pain. No sob. "Very fatigued easily". "Slight ha at times".   Also "I would like some Clobetasol  for hands".

## 2024-04-13 LAB — COMPREHENSIVE METABOLIC PANEL WITH GFR
ALT: 10 IU/L (ref 0–32)
AST: 18 IU/L (ref 0–40)
Albumin: 4.1 g/dL (ref 3.9–4.9)
Alkaline Phosphatase: 122 IU/L — ABNORMAL HIGH (ref 44–121)
BUN/Creatinine Ratio: 13 (ref 12–28)
BUN: 37 mg/dL — ABNORMAL HIGH (ref 8–27)
Bilirubin Total: 0.2 mg/dL (ref 0.0–1.2)
CO2: 20 mmol/L (ref 20–29)
Calcium: 9.9 mg/dL (ref 8.7–10.3)
Chloride: 101 mmol/L (ref 96–106)
Creatinine, Ser: 2.78 mg/dL — ABNORMAL HIGH (ref 0.57–1.00)
Globulin, Total: 3 g/dL (ref 1.5–4.5)
Glucose: 108 mg/dL — ABNORMAL HIGH (ref 70–99)
Potassium: 4.4 mmol/L (ref 3.5–5.2)
Sodium: 140 mmol/L (ref 134–144)
Total Protein: 7.1 g/dL (ref 6.0–8.5)
eGFR: 19 mL/min/{1.73_m2} — ABNORMAL LOW (ref >59)

## 2024-04-13 LAB — CBC WITH DIFFERENTIAL/PLATELET
Basophils Absolute: 0 10*3/uL (ref 0.0–0.2)
Basos: 0 %
EOS (ABSOLUTE): 0.3 10*3/uL (ref 0.0–0.4)
Eos: 3 %
Hematocrit: 35 % (ref 34.0–46.6)
Hemoglobin: 11.7 g/dL (ref 11.1–15.9)
Immature Grans (Abs): 0 10*3/uL (ref 0.0–0.1)
Immature Granulocytes: 0 %
Lymphocytes Absolute: 2.9 10*3/uL (ref 0.7–3.1)
Lymphs: 30 %
MCH: 32.2 pg (ref 26.6–33.0)
MCHC: 33.4 g/dL (ref 31.5–35.7)
MCV: 96 fL (ref 79–97)
Monocytes Absolute: 0.7 10*3/uL (ref 0.1–0.9)
Monocytes: 7 %
Neutrophils Absolute: 5.7 10*3/uL (ref 1.4–7.0)
Neutrophils: 60 %
Platelets: 253 10*3/uL (ref 150–450)
RBC: 3.63 x10E6/uL — ABNORMAL LOW (ref 3.77–5.28)
RDW: 13.4 % (ref 11.7–15.4)
WBC: 9.7 10*3/uL (ref 3.4–10.8)

## 2024-04-16 ENCOUNTER — Ambulatory Visit
Admission: EM | Admit: 2024-04-16 | Discharge: 2024-04-16 | Disposition: A | Discharge status: Home / Self Care | Attending: Family Medicine | Admitting: Family Medicine

## 2024-04-16 ENCOUNTER — Other Ambulatory Visit: Payer: Self-pay

## 2024-04-16 ENCOUNTER — Encounter: Payer: Self-pay | Admitting: Emergency Medicine

## 2024-04-16 ENCOUNTER — Encounter: Payer: Self-pay | Admitting: Podiatry

## 2024-04-16 ENCOUNTER — Ambulatory Visit: Admitting: Podiatry

## 2024-04-16 DIAGNOSIS — M778 Other enthesopathies, not elsewhere classified: Secondary | ICD-10-CM

## 2024-04-16 DIAGNOSIS — N179 Acute kidney failure, unspecified: Secondary | ICD-10-CM | POA: Diagnosis not present

## 2024-04-16 DIAGNOSIS — M795 Residual foreign body in soft tissue: Secondary | ICD-10-CM

## 2024-04-16 DIAGNOSIS — I10 Essential (primary) hypertension: Secondary | ICD-10-CM | POA: Diagnosis not present

## 2024-04-16 HISTORY — DX: Essential (primary) hypertension: I10

## 2024-04-16 NOTE — ED Triage Notes (Signed)
 Pt here for follow up VS and labs

## 2024-04-16 NOTE — Discharge Instructions (Signed)
 We will reach out to you by phone if any of your results are abnormal.

## 2024-04-16 NOTE — ED Provider Notes (Signed)
 [facility]    CSN: [id number] Arrival date & time: 04/16/24  1420      History   Chief Complaint Chief Complaint  Patient presents with   Follow-up    HPI Elaine Martinez is a 63 y.o. female.   HPI Patient seen in clinic on 04/12/2024 for hypotension. Patient has labs drawn prior to IV bolus and labs resulted indicating an acute kidney injury.  Patient is here today for recheck of lab work.  Patient's blood pressure today is stable at 105/72.  Patient has not resumed taking any blood pressure medication and plans to follow-up with her doctor within the next 2 to 4 weeks. Past Medical History:  Diagnosis Date   Diverticulitis of intestine with abscess    Hypertension     Patient Active Problem List   Diagnosis Date Noted   Cellulitis of great toe of left foot 12/31/2023   Acute osteomyelitis of left foot (HCC) 12/30/2023   Morbid obesity with body mass index (BMI) of 40.0 to 44.9 in adult (HCC) 10/25/2023   Acute bronchitis 09/15/2023   HTN (hypertension) 09/15/2023   Obesity (BMI 30-39.9) 09/15/2023   Hypomagnesemia 09/15/2023   Dermatitis 11/07/2022   Abdominal pain 10/21/2020   Diarrhea 10/21/2020   AKI (acute kidney injury) (HCC) 10/21/2020   Metabolic acidosis 10/21/2020   SIRS (systemic inflammatory response syndrome) (HCC) 10/21/2020   Class 3 severe obesity due to excess calories with serious comorbidity and body mass index (BMI) of 40.0 to 44.9 in adult [facility]) 04/11/2019   Acute diverticulitis 12/06/2016   Tobacco abuse 12/06/2016   Morbid obesity due to excess calories (HCC) 04/04/2016   Chest pain in adult 04/04/2016   Essential hypertension 04/04/2016   Dermatitis, unspecified 03/28/2016   Tobacco dependence 03/28/2016    Past Surgical History:  Procedure Laterality Date   HERNIA REPAIR      OB History   No obstetric history on file.      Home Medications    Prior to Admission medications   Medication Sig Start Date End Date  Taking? Authorizing Provider  albuterol  (VENTOLIN  HFA) 108 (90 Base) MCG/ACT inhaler Inhale 2 puffs into the lungs every 6 (six) hours as needed for wheezing or shortness of breath. 09/16/23   Vada Garibaldi, MD  allopurinol  (ZYLOPRIM ) 100 MG tablet TAKE 1 TABLET BY MOUTH EVERY DAY 02/15/24   Charity Conch, DPM  aspirin  81 MG EC tablet Take 81 mg by mouth daily. 04/05/16   [provider]  Biotin 10 MG CAPS Take 10 mg by mouth once a week.    [provider]  clobetasol  ointment (TEMOVATE ) 0.05 % Apply 1 Application topically 2 (two) times daily. 04/12/24   Buena Carmine, NP  colchicine  0.6 MG tablet Take 1 tablet (0.6 mg total) by mouth daily. 01/15/24   Charity Conch, DPM  doxycycline  (VIBRA -TABS) 100 MG tablet Take 1 tablet (100 mg total) by mouth 2 (two) times daily. Patient not taking: Reported on 04/16/2024 02/21/24   Charity Conch, DPM  Ferrous Sulfate  (IRON PO) Take 1 tablet by mouth every 14 (fourteen) days.    [provider]  HYDROcodone -acetaminophen  (NORCO/VICODIN) 5-325 MG tablet Take 1 tablet by mouth every 6 (six) hours as needed. Patient not taking: Reported on 04/16/2024 02/21/24   Charity Conch, DPM  hydrOXYzine (ATARAX) 25 MG tablet Take 25 mg by mouth at bedtime as needed for anxiety (Sleep). 10/24/23   [provider]  ketoconazole (NIZORAL) 2 %  cream Apply 1 Application topically daily as needed for irritation. 10/25/23   [provider]  lisinopril -hydrochlorothiazide  (ZESTORETIC ) 20-25 MG tablet Take 1 tablet by mouth daily. Patient taking differently: Take 1 tablet by mouth daily as needed (HBP). 10/17/22   Teddi Favors, DO  methylPREDNISolone  (MEDROL  DOSEPAK) 4 MG TBPK tablet Take as directed Patient not taking: Reported on 04/16/2024 01/29/24   Charity Conch, DPM  mometasone -formoterol  (DULERA ) 200-5 MCG/ACT AERO Inhale 2 puffs into the lungs 2 (two) times daily. Patient taking differently: Inhale 2 puffs  into the lungs 2 (two) times daily as needed for wheezing or shortness of breath. 09/16/23   Ghimire, Kuber, MD  Multiple Vitamin (MULTIVITAMIN PO) Take 1 tablet by mouth daily.    [provider]  VITAMIN D PO Take 1 tablet by mouth every other day.    [provider]  VITAMIN E PO Take 1 capsule by mouth daily.    [provider]    Family History History reviewed. No pertinent family history.  Social History Social History   Tobacco Use   Smoking status: Every Day    Current packs/day: 0.50    Average packs/day: 0.5 packs/day for 50.6 years (25.3 ttl pk-yrs)    Types: Cigarettes    Start date: 09/14/1973   Smokeless tobacco: Never  Vaping Use   Vaping status: Never Used  Substance Use Topics   Alcohol use: Yes    Comment: occasional   Drug use: Never     Allergies   Patient has no known allergies.   Review of Systems Review of Systems Pertinent negatives listed in HPI   Physical Exam Triage Vital Signs ED Triage Vitals  Encounter Vitals Group     BP      Systolic BP Percentile      Diastolic BP Percentile      Pulse      Resp      Temp      Temp src      SpO2      Weight      Height      Head Circumference      Peak Flow      Pain Score      Pain Loc      Pain Education      Exclude from Growth Chart    No data found.  Updated Vital Signs BP 105/72 (BP Location: Left Arm)   Pulse (!) 109   Temp 98.3 F (36.8 C) (Oral)   Resp 18   SpO2 97%   Visual Acuity Right Eye Distance:   Left Eye Distance:   Bilateral Distance:    Right Eye Near:   Left Eye Near:    Bilateral Near:     Physical Exam General appearance: Alert, well developed, well nourished, cooperative  Head: Normocephalic, without obvious abnormality, atraumatic Heart: Rate and rhythm normal.   Respiratory: Respirations even and unlabored, normal respiratory rate Extremities: No gross deformities Skin: Skin color, texture, turgor normal. No rashes seen   Psych: Appropriate mood and affect.   UC Treatments / Results  Labs (all labs ordered are listed, but only abnormal results are displayed) Labs Reviewed  COMPREHENSIVE METABOLIC PANEL WITH GFR    EKG   Radiology No results found.  Procedures Procedures (including critical care time)  Medications Ordered in UC Medications - No data to display  Initial Impression / Assessment and Plan / UC Course  I have reviewed the triage vital signs  and the nursing notes.  Pertinent labs & imaging results that were available during my care of the patient were reviewed by me and considered in my medical decision making (see chart for details).    Repeat CMP pending.  Patient advised to please return or call if she receives a phone call from our office i notified of lab results.  If AKI remains elevated BUN and elevated creatinine, patient will need to follow-up at the emergency department.  Advised that CMP results should be available within 24 hours or less.  Patient's blood pressure appears stable however I would like for her to follow-up with her PCP given the fluctuations of her blood pressure as she has had a few low BP readings at home in the lower 90s/60s.  Patient reports she will schedule follow-up appointment with PCP. Final Clinical Impressions(s) / UC Diagnoses   Final diagnoses:  AKI (acute kidney injury) (HCC)  Essential hypertension     Discharge Instructions      We will reach out to you by phone if any of your results are abnormal.     ED Prescriptions   None    PDMP not reviewed this encounter.   Buena Carmine, NP 04/16/24 [phone]

## 2024-04-17 LAB — COMPREHENSIVE METABOLIC PANEL WITH GFR
ALT: 10 IU/L (ref 0–32)
AST: 15 IU/L (ref 0–40)
Albumin: 3.8 g/dL — ABNORMAL LOW (ref 3.9–4.9)
Alkaline Phosphatase: 104 IU/L (ref 44–121)
BUN/Creatinine Ratio: 14 (ref 12–28)
BUN: 16 mg/dL (ref 8–27)
Bilirubin Total: 0.3 mg/dL (ref 0.0–1.2)
CO2: 18 mmol/L — ABNORMAL LOW (ref 20–29)
Calcium: 9.9 mg/dL (ref 8.7–10.3)
Chloride: 106 mmol/L (ref 96–106)
Creatinine, Ser: 1.12 mg/dL — ABNORMAL HIGH (ref 0.57–1.00)
Globulin, Total: 2.8 g/dL (ref 1.5–4.5)
Glucose: 103 mg/dL — ABNORMAL HIGH (ref 70–99)
Potassium: 4 mmol/L (ref 3.5–5.2)
Sodium: 140 mmol/L (ref 134–144)
Total Protein: 6.6 g/dL (ref 6.0–8.5)
eGFR: 55 mL/min/{1.73_m2} — ABNORMAL LOW (ref >59)

## 2024-04-17 NOTE — Progress Notes (Signed)
 Subjective: Chief Complaint  Patient presents with   Foot Pain    RM#11 Follow up on left foot still experiencing swelling if on her foot.Patient states pain is better had went to urgent care for high blood pressure and will be having lab work for kidney function levels.     63 year old female presents the office with above concerns.  She states that her feet feel so much better than they did.  She states that she is working 10-hour days he is some discomfort afterwards open with on her feet a lot but otherwise she is very happy the progress.  She is making some swelling to her ankles but she is here urgent care because of blood pressure issues and she states that her kidney function has decreased.   Objective: AAO x3, NAD DP/PT pulses palpable bilaterally, CRT less than 3 seconds Is incisions all healed well.  Not able to appreciate any area of tenderness bilaterally.  There is chronic edema present bilateral feet and ankles but there is no erythema or warmth.  Flexor, sensory tendons intact. No pain with calf compression, swelling, warmth, erythema  Assessment: Status post foreign body removal, bone biopsy; right foot capsulitis  Plan: -All treatment options discussed with the patient including all alternatives, risks, complications.  -Overall her symptoms have improved and not having any foot pain.  Ability to go out to work full-time this week without any restrictions.  Continue to elevate discussed compression socks given the swelling.  Also follow-up with her PCP in regards to the kidney function.   Return in about 2 months (around 06/16/2024).  Charity Conch DPM

## 2024-04-28 ENCOUNTER — Ambulatory Visit: Admission: EM | Admit: 2024-04-28 | Discharge: 2024-04-28 | Disposition: A | Discharge status: Home / Self Care

## 2024-04-28 DIAGNOSIS — R197 Diarrhea, unspecified: Secondary | ICD-10-CM | POA: Diagnosis not present

## 2024-04-28 NOTE — ED Triage Notes (Addendum)
"  Diarrhea started last night after going out to a restaurant ([employer]) and I fell like it is so much it is giving me a ha". No fever. Some nausea/vomiting (last episode: last night, after eating).

## 2024-04-28 NOTE — ED Provider Notes (Signed)
 [facility]    CSN: [id number] Arrival date & time: 04/28/24  1533      History   Chief Complaint Chief Complaint  Patient presents with   Headache   Diarrhea    HPI Elaine Martinez is a 64 y.o. female.   Patient here today for evaluation of diarrhea that started last night after she had went out to eat at a [place] buffet.  She reports that she has had some mild headache now due to diarrhea.  She has had some nausea and reports 1 episode of vomiting.  She has not had any fever.  She reports diarrhea has been watery.  She denies any blood in her stool.  The history is provided by the patient.  Headache Associated symptoms: diarrhea   Associated symptoms: no abdominal pain, no fever, no nausea and no vomiting   Diarrhea Associated symptoms: headaches   Associated symptoms: no abdominal pain, no chills, no fever and no vomiting     Past Medical History:  Diagnosis Date   Diverticulitis of intestine with abscess    Hypertension     Patient Active Problem List   Diagnosis Date Noted   Cellulitis of great toe of left foot 12/31/2023   Acute osteomyelitis of left foot (HCC) 12/30/2023   Morbid obesity with body mass index (BMI) of 40.0 to 44.9 in adult (HCC) 10/25/2023   Acute bronchitis 09/15/2023   HTN (hypertension) 09/15/2023   Obesity (BMI 30-39.9) 09/15/2023   Hypomagnesemia 09/15/2023   Dermatitis 11/07/2022   Abdominal pain 10/21/2020   Diarrhea 10/21/2020   AKI (acute kidney injury) (HCC) 10/21/2020   Metabolic acidosis 10/21/2020   SIRS (systemic inflammatory response syndrome) (HCC) 10/21/2020   Class 3 severe obesity due to excess calories with serious comorbidity and body mass index (BMI) of 40.0 to 44.9 in adult 04/11/2019   Acute diverticulitis 12/06/2016   Tobacco abuse 12/06/2016   Morbid obesity due to excess calories (HCC) 04/04/2016   Chest pain in adult 04/04/2016   Essential hypertension 04/04/2016   Dermatitis, unspecified 03/28/2016    Tobacco dependence 03/28/2016    Past Surgical History:  Procedure Laterality Date   HERNIA REPAIR      OB History   No obstetric history on file.      Home Medications    Prior to Admission medications   Medication Sig Start Date End Date Taking? Authorizing Provider  allopurinol  (ZYLOPRIM ) 100 MG tablet TAKE 1 TABLET BY MOUTH EVERY DAY 02/15/24  Yes Charity Conch, DPM  aspirin  81 MG EC tablet Take 81 mg by mouth daily. 04/05/16  Yes [provider]  Biotin 10 MG CAPS Take 10 mg by mouth once a week.   Yes [provider]  colchicine  0.6 MG tablet Take 1 tablet (0.6 mg total) by mouth daily. 01/15/24  Yes Charity Conch, DPM  Ferrous Sulfate  (IRON PO) Take 1 tablet by mouth every 14 (fourteen) days.   Yes [provider]  lisinopril -hydrochlorothiazide  (ZESTORETIC ) 20-25 MG tablet Take 1 tablet by mouth daily. Patient taking differently: Take 1 tablet by mouth daily as needed (HBP). 10/17/22  Yes Russella Courts A, DO  loperamide (IMODIUM) 2 MG capsule Take by mouth as needed for diarrhea or loose stools.   Yes [provider]  Multiple Vitamin (MULTIVITAMIN PO) Take 1 tablet by mouth daily.   Yes [provider]  VITAMIN D PO Take 1 tablet by mouth every other day.   Yes [provider]  VITAMIN E PO Take 1 capsule by mouth daily.   Yes [provider]  albuterol  (VENTOLIN  HFA) 108 (90 Base) MCG/ACT inhaler Inhale 2 puffs into the lungs every 6 (six) hours as needed for wheezing or shortness of breath. 09/16/23   Vada Garibaldi, MD  clobetasol  ointment (TEMOVATE ) 0.05 % Apply 1 Application topically 2 (two) times daily. 04/12/24   Buena Carmine, NP  HYDROcodone -acetaminophen  (NORCO/VICODIN) 5-325 MG tablet Take 1 tablet by mouth every 6 (six) hours as needed. Patient not taking: Reported on 04/16/2024 02/21/24   Charity Conch, DPM  hydrOXYzine (ATARAX) 25 MG tablet Take 25 mg by mouth at bedtime as needed for  anxiety (Sleep). 10/24/23   [provider]  ketoconazole (NIZORAL) 2 % cream Apply 1 Application topically daily as needed for irritation. 10/25/23   [provider]  mometasone -formoterol  (DULERA ) 200-5 MCG/ACT AERO Inhale 2 puffs into the lungs 2 (two) times daily. Patient taking differently: Inhale 2 puffs into the lungs 2 (two) times daily as needed for wheezing or shortness of breath. 09/16/23   Vada Garibaldi, MD    Family History History reviewed. No pertinent family history.  Social History Social History   Tobacco Use   Smoking status: Every Day    Current packs/day: 0.50    Average packs/day: 0.5 packs/day for 50.6 years (25.3 ttl pk-yrs)    Types: Cigarettes    Start date: 09/14/1973   Smokeless tobacco: Never  Vaping Use   Vaping status: Never Used  Substance Use Topics   Alcohol use: Yes    Comment: occasional   Drug use: Never     Allergies   Patient has no known allergies.   Review of Systems Review of Systems  Constitutional:  Negative for chills and fever.  Eyes:  Negative for discharge and redness.  Respiratory:  Negative for shortness of breath.   Gastrointestinal:  Positive for diarrhea. Negative for abdominal pain, nausea and vomiting.  Neurological:  Positive for headaches.     Physical Exam Triage Vital Signs ED Triage Vitals  Encounter Vitals Group     BP 04/28/24 1543 132/82     Systolic BP Percentile --      Diastolic BP Percentile --      Pulse Rate 04/28/24 1543 92     Resp 04/28/24 1543 18     Temp 04/28/24 1543 97.9 F (36.6 C)     Temp Source 04/28/24 1543 Oral     SpO2 04/28/24 1543 99 %     Weight 04/28/24 1540 259 lb 14.8 oz (117.9 kg)     Height 04/28/24 1540 5\' 3"  (1.6 m)     Head Circumference --      Peak Flow --      Pain Score 04/28/24 1535 6     Pain Loc --      Pain Education --      Exclude from Growth Chart --    No data found.  Updated Vital Signs BP 132/82 (BP Location: Left Arm)   Pulse  92   Temp 97.9 F (36.6 C) (Oral)   Resp 18   Ht 5\' 3"  (1.6 m)   Wt 259 lb 14.8 oz (117.9 kg)   SpO2 99%   BMI 46.04 kg/m   Visual Acuity Right Eye Distance:   Left Eye Distance:   Bilateral Distance:    Right Eye Near:   Left Eye Near:    Bilateral Near:     Physical Exam Vitals  and nursing note reviewed.  Constitutional:      General: She is not in acute distress.    Appearance: Normal appearance. She is not ill-appearing.  HENT:     Head: Normocephalic and atraumatic.  Eyes:     Conjunctiva/sclera: Conjunctivae normal.  Cardiovascular:     Rate and Rhythm: Normal rate and regular rhythm.  Pulmonary:     Effort: Pulmonary effort is normal. No respiratory distress.     Breath sounds: No wheezing, rhonchi or rales.  Abdominal:     General: Bowel sounds are normal. There is no distension.     Palpations: Abdomen is soft.     Tenderness: There is no abdominal tenderness. There is no guarding.  Neurological:     Mental Status: She is alert.  Psychiatric:        Mood and Affect: Mood normal.        Behavior: Behavior normal.        Thought Content: Thought content normal.      UC Treatments / Results  Labs (all labs ordered are listed, but only abnormal results are displayed) Labs Reviewed - No data to display  EKG   Radiology No results found.  Procedures Procedures (including critical care time)  Medications Ordered in UC Medications - No data to display  Initial Impression / Assessment and Plan / UC Course  I have reviewed the triage vital signs and the nursing notes.  Pertinent labs & imaging results that were available during my care of the patient were reviewed by me and considered in my medical decision making (see chart for details).    Suspect diarrhea from food she consumed night before.  Recommended she continue symptomatic treatment, increase fluids with electrolyte replacement, bland diet and follow-up if no gradual improvement or with  any worsening.  Patient expressed understanding.  Final Clinical Impressions(s) / UC Diagnoses   Final diagnoses:  Diarrhea, unspecified type   Discharge Instructions   None    ED Prescriptions   None    PDMP not reviewed this encounter.   Vernestine Gondola, PA-C 04/28/24 1555

## 2024-05-12 ENCOUNTER — Ambulatory Visit: Admission: EM | Admit: 2024-05-12 | Discharge: 2024-05-12 | Disposition: A | Discharge status: Home / Self Care

## 2024-05-12 DIAGNOSIS — S39012A Strain of muscle, fascia and tendon of lower back, initial encounter: Secondary | ICD-10-CM | POA: Diagnosis not present

## 2024-05-12 LAB — POCT URINALYSIS DIP (MANUAL ENTRY)
Bilirubin, UA: NEGATIVE
Blood, UA: NEGATIVE
Glucose, UA: NEGATIVE mg/dL
Ketones, POC UA: NEGATIVE mg/dL
Leukocytes, UA: NEGATIVE
Nitrite, UA: NEGATIVE
Protein Ur, POC: NEGATIVE mg/dL
Spec Grav, UA: 1.02
Urobilinogen, UA: 0.2 U/dL
pH, UA: 6

## 2024-05-12 MED ORDER — KETOROLAC TROMETHAMINE 30 MG/ML IJ SOLN
30.0000 mg | Freq: Once | INTRAMUSCULAR | Status: AC
  Administered 2024-05-12: 30 mg via INTRAMUSCULAR

## 2024-05-12 MED ORDER — DEXAMETHASONE SODIUM PHOSPHATE 10 MG/ML IJ SOLN
10.0000 mg | Freq: Once | INTRAMUSCULAR | Status: AC
  Administered 2024-05-12: 10 mg via INTRAMUSCULAR

## 2024-05-12 MED ORDER — METHOCARBAMOL 500 MG PO TABS: 500.0000 mg | ORAL_TABLET | Freq: Every morning | ORAL | 0 refills | Status: AC

## 2024-05-12 MED ORDER — CYCLOBENZAPRINE HCL 10 MG PO TABS: 10.0000 mg | ORAL_TABLET | Freq: Every day | ORAL | 0 refills | Status: AC

## 2024-05-12 NOTE — ED Provider Notes (Signed)
 [facility]    CSN: [id number] Arrival date & time: 05/12/24  1358      History   Chief Complaint Chief Complaint  Patient presents with   Back Pain    HPI Aleasha Fregeau is a 63 y.o. female.   Kennidee Heyne is a 63 y.o. female, who works night shifts as a Lawyer, presents with lower back pain that started two days ago and has worsened. The pain originated while lifting a heavy patient weighing approximately 325 pounds. The patient describes pain from her lower belly wrapping around to her back, occasionally radiating down her right leg. She denies numbness, tingling, or burning sensations. The patient reports increased water intake causing increased urination but no dysuria or other urinary symptoms. She has not taken any pain medication due to concerns about her kidney function. The patient emphasizes the importance of attending work due to holiday pay, which requires her to work the day before and after the holiday. Her job as a Lawyer involves pulling and lifting, which she has been doing for over thirty years.   The following portions of the patient's history were reviewed and updated as appropriate: allergies, current medications, past family history, past medical history, past social history, past surgical history, and problem list.        Past Medical History:  Diagnosis Date   Diverticulitis of intestine with abscess    Hypertension     Patient Active Problem List   Diagnosis Date Noted   Cellulitis of great toe of left foot 12/31/2023   Acute osteomyelitis of left foot (HCC) 12/30/2023   Morbid obesity with body mass index (BMI) of 40.0 to 44.9 in adult (HCC) 10/25/2023   Acute bronchitis 09/15/2023   HTN (hypertension) 09/15/2023   Obesity (BMI 30-39.9) 09/15/2023   Hypomagnesemia 09/15/2023   Dermatitis 11/07/2022   Abdominal pain 10/21/2020   Diarrhea 10/21/2020   AKI (acute kidney injury) (HCC) 10/21/2020   Metabolic acidosis 10/21/2020   SIRS  (systemic inflammatory response syndrome) (HCC) 10/21/2020   Class 3 severe obesity due to excess calories with serious comorbidity and body mass index (BMI) of 40.0 to 44.9 in adult 04/11/2019   Acute diverticulitis 12/06/2016   Tobacco abuse 12/06/2016   Morbid obesity due to excess calories (HCC) 04/04/2016   Chest pain in adult 04/04/2016   Essential hypertension 04/04/2016   Dermatitis, unspecified 03/28/2016   Tobacco dependence 03/28/2016    Past Surgical History:  Procedure Laterality Date   HERNIA REPAIR      OB History   No obstetric history on file.      Home Medications    Prior to Admission medications   Medication Sig Start Date End Date Taking? Authorizing Provider  albuterol  (VENTOLIN  HFA) 108 (90 Base) MCG/ACT inhaler Inhale 2 puffs into the lungs every 6 (six) hours as needed for wheezing or shortness of breath. 09/16/23  Yes Vada Garibaldi, MD  allopurinol  (ZYLOPRIM ) 100 MG tablet TAKE 1 TABLET BY MOUTH EVERY DAY 02/15/24  Yes Charity Conch, DPM  aspirin  81 MG EC tablet Take 81 mg by mouth daily. 04/05/16  Yes [provider]  Biotin 10 MG CAPS Take 10 mg by mouth once a week.   Yes [provider]  Cetirizine HCl (ZYRTEC ALLERGY) 10 MG CAPS Take 1 capsule by mouth daily. 09/25/17  Yes [provider]  colchicine  0.6 MG tablet Take 1 tablet (0.6 mg total) by mouth daily. 01/15/24  Yes Charity Conch, DPM  cyclobenzaprine (  FLEXERIL) 10 MG tablet Take 1 tablet (10 mg total) by mouth at bedtime. 05/12/24  Yes Maryruth Sol, FNP  hydrOXYzine (ATARAX) 25 MG tablet Take 25 mg by mouth at bedtime as needed for anxiety (Sleep). 10/24/23  Yes [provider]  methocarbamol (ROBAXIN) 500 MG tablet Take 1 tablet (500 mg total) by mouth every morning. 05/12/24  Yes Maryruth Sol, FNP  mometasone -formoterol  (DULERA ) 200-5 MCG/ACT AERO Inhale 2 puffs into the lungs 2 (two) times daily. Patient taking differently: Inhale 2 puffs  into the lungs 2 (two) times daily as needed for wheezing or shortness of breath. 09/16/23  Yes Ghimire, Kuber, MD  Multiple Vitamin (MULTIVITAMIN PO) Take 1 tablet by mouth daily.   Yes [provider]  triamcinolone  ointment (KENALOG) 0.1 % Apply 1 Application topically 2 (two) times daily. 09/25/17  Yes [provider]  VITAMIN D PO Take 1 tablet by mouth every other day.   Yes [provider]  VITAMIN E PO Take 1 capsule by mouth daily.   Yes [provider]  clobetasol  ointment (TEMOVATE ) 0.05 % Apply 1 Application topically 2 (two) times daily. 04/12/24   Buena Carmine, NP  Ferrous Sulfate  (IRON PO) Take 1 tablet by mouth every 14 (fourteen) days.    [provider]  HYDROcodone -acetaminophen  (NORCO/VICODIN) 5-325 MG tablet Take 1 tablet by mouth every 6 (six) hours as needed. Patient not taking: Reported on 04/16/2024 02/21/24   Charity Conch, DPM  ketoconazole (NIZORAL) 2 % cream Apply 1 Application topically daily as needed for irritation. 10/25/23   [provider]  lisinopril -hydrochlorothiazide  (ZESTORETIC ) 20-25 MG tablet Take 1 tablet by mouth daily. Patient taking differently: Take 1 tablet by mouth daily as needed (HBP). 10/17/22   Teddi Favors, DO  loperamide (IMODIUM) 2 MG capsule Take by mouth as needed for diarrhea or loose stools.    [provider]  lubiprostone (AMITIZA) 24 MCG capsule Take 24 mcg by mouth 2 (two) times daily with a meal.    [provider]    Family History History reviewed. No pertinent family history.  Social History Social History   Tobacco Use   Smoking status: Every Day    Current packs/day: 0.50    Average packs/day: 0.5 packs/day for 50.7 years (25.3 ttl pk-yrs)    Types: Cigarettes    Start date: 09/14/1973   Smokeless tobacco: Never  Vaping Use   Vaping status: Never Used  Substance Use Topics   Alcohol use: Yes    Comment: occasional   Drug use: Never      Allergies   Patient has no known allergies.   Review of Systems Review of Systems  Constitutional:  Negative for fever.  Gastrointestinal:  Positive for abdominal pain (lower abdominal pain). Negative for nausea and vomiting.  Genitourinary:  Positive for frequency (drinking more since symptoms started which has caused her to use the bathroom more often). Negative for dysuria, hematuria, urgency and vaginal discharge.  Musculoskeletal:  Positive for back pain (lower back).  Neurological:  Negative for weakness and numbness.  All other systems reviewed and are negative.    Physical Exam Triage Vital Signs ED Triage Vitals  Encounter Vitals Group     BP 05/12/24 1409 125/81     Systolic BP Percentile --      Diastolic BP Percentile --      Pulse Rate 05/12/24 1409 99     Resp 05/12/24 1409 20     Temp 05/12/24 1409  97.7 F (36.5 C)     Temp Source 05/12/24 1409 Oral     SpO2 05/12/24 1409 98 %     Weight 05/12/24 1406 217 lb (98.4 kg)     Height 05/12/24 1406 5\' 3"  (1.6 m)     Head Circumference --      Peak Flow --      Pain Score 05/12/24 1403 7     Pain Loc --      Pain Education --      Exclude from Growth Chart --    No data found.  Updated Vital Signs BP 125/81 (BP Location: Left Arm)   Pulse 99   Temp 97.7 F (36.5 C) (Oral)   Resp 20   Ht 5\' 3"  (1.6 m)   Wt 217 lb (98.4 kg)   SpO2 98%   BMI 38.44 kg/m   Visual Acuity Right Eye Distance:   Left Eye Distance:   Bilateral Distance:    Right Eye Near:   Left Eye Near:    Bilateral Near:     Physical Exam Vitals reviewed.  Constitutional:      General: She is not in acute distress.    Appearance: Normal appearance. She is not ill-appearing, toxic-appearing or diaphoretic.  HENT:     Head: Normocephalic.     Mouth/Throat:     Mouth: Mucous membranes are moist.  Cardiovascular:     Rate and Rhythm: Normal rate and regular rhythm.  Pulmonary:     Effort: Pulmonary effort is normal.      Breath sounds: Normal breath sounds.  Abdominal:     General: Bowel sounds are normal. There is no distension.     Palpations: Abdomen is soft.     Tenderness: There is no abdominal tenderness. There is no right CVA tenderness or left CVA tenderness.  Musculoskeletal:        General: Normal range of motion.     Cervical back: Normal, normal range of motion and neck supple.     Thoracic back: Normal.     Lumbar back: Tenderness present. No swelling, deformity, lacerations or spasms. Normal range of motion. Negative right straight leg raise test and negative left straight leg raise test.  Skin:    General: Skin is warm and dry.  Neurological:     General: No focal deficit present.     Mental Status: She is alert and oriented to person, place, and time.     Cranial Nerves: Cranial nerves 2-12 are intact.     Sensory: Sensation is intact.     Motor: Motor function is intact. No weakness.     Coordination: Coordination is intact.     Gait: Gait is intact.      UC Treatments / Results  Labs (all labs ordered are listed, but only abnormal results are displayed) Labs Reviewed  POCT URINALYSIS DIP (MANUAL ENTRY) - Normal    EKG   Radiology No results found.  Procedures Procedures (including critical care time)  Medications Ordered in UC Medications  ketorolac  (TORADOL ) 30 MG/ML injection 30 mg (has no administration in time range)  dexamethasone (DECADRON) injection 10 mg (has no administration in time range)    Initial Impression / Assessment and Plan / UC Course  I have reviewed the triage vital signs and the nursing notes.  Pertinent labs & imaging results that were available during my care of the patient were reviewed by me and considered in my medical decision making (see chart for  details).    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 The pain wraps from the lower abdomen  to the back and occasionally radiates down the leg. She denies numbness, tingling, burning, or urinary symptoms. She has a known history of previously elevated kidney function, with a creatinine of 2.78 on 04/12/2024 that improved to 1.12 by 04/16/2024 with hydration. Given the mechanism of injury and her symptom pattern, this presentation is consistent with a lower back strain. In clinic, she was given intramuscular Toradol  and Decadron for immediate pain relief and anti-inflammatory effect. She was advised to use acetaminophen  for breakthrough pain and to avoid NSAIDs due to her prior episode of acute kidney injury. Muscle relaxants were prescribed, including Robaxin 500 mg in the morning and Flexeril 10 mg at night. Patient was also counseled on safe lifting techniques and   Today's evaluation has revealed no signs of a dangerous process. Discussed diagnosis with patient and/or guardian. Patient and/or guardian aware of their diagnosis, possible red flag symptoms to watch out for and need for close follow up. Patient and/or guardian understands verbal and written discharge instructions. Patient and/or guardian comfortable with plan and disposition.  Patient and/or guardian has a clear mental status at this time, good insight into illness (after discussion and teaching) and has clear judgment to make decisions regarding their care  Documentation was completed with the aid of voice recognition software. Transcription may contain typographical errors.  Proper body mechanics to prevent future injury.    Final Clinical Impressions(s) / UC Diagnoses   Final diagnoses:  Acute myofascial strain of lumbar region, initial encounter     Discharge Instructions      You were seen today for a lumbar strain, which is an injury involving the muscles or tendons in your lower back. To help with your recovery, take all prescribed medications exactly as directed. You may take Tylenol  (acetaminophen ) 1000 mg every six  hours for additional pain relief. This equals two 500 mg tablets at a time. Be careful not to take more than 4000 mg of Tylenol  in a 24-hour period.  To reduce pain and inflammation, alternate between applying ice and heat to the affected area throughout the day. Always place a towel between your skin and the ice pack to avoid skin damage. Avoid lifting anything heavier than 10 pounds or carrying more than 5 pounds for the next several days, as this can worsen your symptoms. Rest as needed, and follow up with your primary care or orthopedics if symptoms do not improve or if they get worse.  Go to the ED immediately if your back pain is severe, you are unable to stand or walk, you develop pain in your legs, you have weakness in your buttocks or legs., you have trouble controlling when you urinate or when you have a bowel movement, or you have frequent, painful, or bloody urination.    ED Prescriptions     Medication Sig Dispense Auth. Provider   cyclobenzaprine (FLEXERIL) 10 MG tablet Take 1 tablet (10 mg total) by mouth at bedtime. 7 tablet Maryruth Sol, FNP   methocarbamol (ROBAXIN) 500 MG tablet Take 1 tablet (500 mg total) by mouth every morning. 7 tablet Maryruth Sol, FNP      PDMP not reviewed this encounter.   Maryruth Sol, [place] 05/12/24 1501

## 2024-05-12 NOTE — Discharge Instructions (Addendum)
 You were seen today for a lumbar strain, which is an injury involving the muscles or tendons in your lower back. To help with your recovery, take all prescribed medications exactly as directed. You may take Tylenol  (acetaminophen ) 1000 mg every six hours for additional pain relief. This equals two 500 mg tablets at a time. Be careful not to take more than 4000 mg of Tylenol  in a 24-hour period.  To reduce pain and inflammation, alternate between applying ice and heat to the affected area throughout the day. Always place a towel between your skin and the ice pack to avoid skin damage. Avoid lifting anything heavier than 10 pounds or carrying more than 5 pounds for the next several days, as this can worsen your symptoms. Rest as needed, and follow up with your primary care or orthopedics if symptoms do not improve or if they get worse.  Go to the ED immediately if your back pain is severe, you are unable to stand or walk, you develop pain in your legs, you have weakness in your buttocks or legs., you have trouble controlling when you urinate or when you have a bowel movement, or you have frequent, painful, or bloody urination.

## 2024-05-12 NOTE — ED Triage Notes (Signed)
"  My right lower back is hurting me really bad last night, this pain did radiate down to my right hip/buttocks". "One or two times when I had this pain it did radiate to my stomach but gone now". No nausea. No vomiting. No dysuria.

## 2024-06-17 ENCOUNTER — Ambulatory Visit: Admitting: Podiatry

## 2024-08-24 ENCOUNTER — Other Ambulatory Visit: Payer: Self-pay | Admitting: Podiatry
